# Patient Record
Sex: Female | Born: 1965 | Race: White | Hispanic: No | Marital: Single | Smoking: Never smoker
Health system: Southern US, Community
[De-identification: ages and names within clinical notes are randomized; demographics above are authoritative.]

## PROBLEM LIST (undated history)

## (undated) ENCOUNTER — Emergency Department (HOSPITAL_COMMUNITY): Disposition: A | Discharge status: Other Acute Inpatient Hospital | Payer: Commercial Managed Care - PPO

## (undated) ENCOUNTER — Emergency Department (HOSPITAL_COMMUNITY): Disposition: A | Discharge status: Home / Self Care | Payer: Self-pay

## (undated) DIAGNOSIS — D649 Anemia, unspecified: Secondary | ICD-10-CM

## (undated) DIAGNOSIS — R011 Cardiac murmur, unspecified: Secondary | ICD-10-CM

## (undated) DIAGNOSIS — K589 Irritable bowel syndrome without diarrhea: Secondary | ICD-10-CM

## (undated) DIAGNOSIS — T7840XA Allergy, unspecified, initial encounter: Secondary | ICD-10-CM

## (undated) HISTORY — DX: Allergy, unspecified, initial encounter: T78.40XA

## (undated) HISTORY — DX: Anemia, unspecified: D64.9

## (undated) HISTORY — DX: Irritable bowel syndrome, unspecified: K58.9

## (undated) HISTORY — DX: Cardiac murmur, unspecified: R01.1

---

## 2001-11-07 ENCOUNTER — Encounter (INDEPENDENT_AMBULATORY_CARE_PROVIDER_SITE_OTHER): Payer: Self-pay | Admitting: *Deleted

## 2001-11-07 LAB — CONVERTED CEMR LAB

## 2001-11-26 ENCOUNTER — Encounter: Admission: RE | Admit: 2001-11-26 | Discharge: 2001-11-26 | Payer: Self-pay | Admitting: Family Medicine

## 2001-12-28 ENCOUNTER — Encounter: Admission: RE | Admit: 2001-12-28 | Discharge: 2001-12-28 | Payer: Self-pay | Admitting: Sports Medicine

## 2002-01-05 ENCOUNTER — Ambulatory Visit (HOSPITAL_COMMUNITY): Admission: RE | Admit: 2002-01-05 | Discharge: 2002-01-05 | Payer: Self-pay

## 2002-01-18 ENCOUNTER — Encounter: Admission: RE | Admit: 2002-01-18 | Discharge: 2002-01-18 | Payer: Self-pay | Admitting: Family Medicine

## 2002-01-20 ENCOUNTER — Encounter: Admission: RE | Admit: 2002-01-20 | Discharge: 2002-01-20 | Payer: Self-pay | Admitting: Family Medicine

## 2002-01-27 ENCOUNTER — Encounter: Admission: RE | Admit: 2002-01-27 | Discharge: 2002-01-27 | Payer: Self-pay | Admitting: Family Medicine

## 2002-02-02 ENCOUNTER — Encounter: Admission: RE | Admit: 2002-02-02 | Discharge: 2002-03-18 | Payer: Self-pay | Admitting: Family Medicine

## 2002-02-05 ENCOUNTER — Encounter: Admission: RE | Admit: 2002-02-05 | Discharge: 2002-02-05 | Payer: Self-pay | Admitting: Family Medicine

## 2002-02-15 ENCOUNTER — Encounter: Admission: RE | Admit: 2002-02-15 | Discharge: 2002-02-15 | Payer: Self-pay | Admitting: Family Medicine

## 2002-03-02 ENCOUNTER — Encounter: Admission: RE | Admit: 2002-03-02 | Discharge: 2002-03-02 | Payer: Self-pay | Admitting: Sports Medicine

## 2002-03-11 ENCOUNTER — Encounter: Admission: RE | Admit: 2002-03-11 | Discharge: 2002-03-11 | Payer: Self-pay | Admitting: *Deleted

## 2002-03-14 ENCOUNTER — Encounter: Payer: Self-pay | Admitting: *Deleted

## 2002-03-15 ENCOUNTER — Inpatient Hospital Stay (HOSPITAL_COMMUNITY): Admission: AD | Admit: 2002-03-15 | Discharge: 2002-03-18 | Payer: Self-pay | Admitting: *Deleted

## 2002-03-22 ENCOUNTER — Encounter: Admission: RE | Admit: 2002-03-22 | Discharge: 2002-03-22 | Payer: Self-pay | Admitting: Family Medicine

## 2002-03-24 ENCOUNTER — Encounter (HOSPITAL_COMMUNITY): Admission: RE | Admit: 2002-03-24 | Discharge: 2002-04-19 | Payer: Self-pay | Admitting: *Deleted

## 2002-04-15 ENCOUNTER — Encounter: Payer: Self-pay | Admitting: *Deleted

## 2002-04-20 ENCOUNTER — Inpatient Hospital Stay (HOSPITAL_COMMUNITY): Admission: AD | Admit: 2002-04-20 | Discharge: 2002-04-23 | Payer: Self-pay | Admitting: *Deleted

## 2002-06-01 ENCOUNTER — Encounter: Admission: RE | Admit: 2002-06-01 | Discharge: 2002-06-01 | Payer: Self-pay | Admitting: Sports Medicine

## 2002-07-22 ENCOUNTER — Encounter: Admission: RE | Admit: 2002-07-22 | Discharge: 2002-07-22 | Payer: Self-pay | Admitting: Family Medicine

## 2002-07-28 ENCOUNTER — Encounter: Payer: Self-pay | Admitting: Sports Medicine

## 2002-07-28 ENCOUNTER — Encounter: Admission: RE | Admit: 2002-07-28 | Discharge: 2002-07-28 | Payer: Self-pay | Admitting: Sports Medicine

## 2002-08-12 ENCOUNTER — Encounter: Admission: RE | Admit: 2002-08-12 | Discharge: 2002-08-12 | Payer: Self-pay | Admitting: Family Medicine

## 2002-11-04 ENCOUNTER — Encounter: Admission: RE | Admit: 2002-11-04 | Discharge: 2002-11-04 | Payer: Self-pay | Admitting: Family Medicine

## 2002-11-17 ENCOUNTER — Encounter: Admission: RE | Admit: 2002-11-17 | Discharge: 2002-11-17 | Payer: Self-pay | Admitting: Family Medicine

## 2002-12-06 ENCOUNTER — Encounter: Admission: RE | Admit: 2002-12-06 | Discharge: 2002-12-06 | Payer: Self-pay | Admitting: Family Medicine

## 2005-09-21 ENCOUNTER — Inpatient Hospital Stay (HOSPITAL_COMMUNITY): Admission: AD | Admit: 2005-09-21 | Discharge: 2005-09-21 | Payer: Self-pay | Admitting: *Deleted

## 2005-09-24 ENCOUNTER — Other Ambulatory Visit: Admission: RE | Admit: 2005-09-24 | Discharge: 2005-09-24 | Payer: Self-pay | Admitting: Obstetrics & Gynecology

## 2005-09-24 ENCOUNTER — Ambulatory Visit: Payer: Self-pay | Admitting: Obstetrics & Gynecology

## 2005-12-18 ENCOUNTER — Ambulatory Visit: Payer: Self-pay | Admitting: Obstetrics and Gynecology

## 2006-07-21 ENCOUNTER — Inpatient Hospital Stay (HOSPITAL_COMMUNITY): Admission: AD | Admit: 2006-07-21 | Discharge: 2006-07-21 | Payer: Self-pay | Admitting: Obstetrics & Gynecology

## 2006-12-05 ENCOUNTER — Encounter (INDEPENDENT_AMBULATORY_CARE_PROVIDER_SITE_OTHER): Payer: Self-pay | Admitting: *Deleted

## 2006-12-10 ENCOUNTER — Inpatient Hospital Stay (HOSPITAL_COMMUNITY): Admission: AD | Admit: 2006-12-10 | Discharge: 2006-12-10 | Payer: Self-pay | Admitting: Obstetrics and Gynecology

## 2006-12-26 ENCOUNTER — Inpatient Hospital Stay (HOSPITAL_COMMUNITY): Admission: RE | Admit: 2006-12-26 | Discharge: 2006-12-31 | Payer: Self-pay | Admitting: Obstetrics and Gynecology

## 2007-04-23 IMAGING — US US PELVIS COMPLETE MODIFY
1 series · 14 of 25 positions shown · non-contrast
Comparison: none

CLINICAL DATA: Vaginal bleeding and cramping for three months.  Negative pregnancy test.
 TRANSABDOMINAL AND TRANSVAGINAL PELVIC ULTRASOUND ? 09/21/05:
TECHNIQUE: Both transabdominal and transvaginal ultrasound examinations of the pelvis were performed including evaluation of the uterus, ovaries, adnexal regions, and pelvic cul-de-sac.

[Series 1: us pelvis complete modify · 0.29mm/px · 14 of 85 slices shown]
[im 1/85]
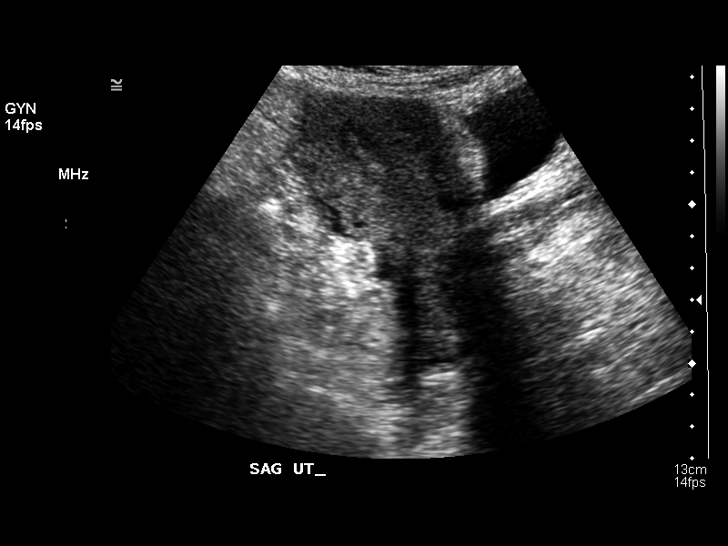
[im 8/85]
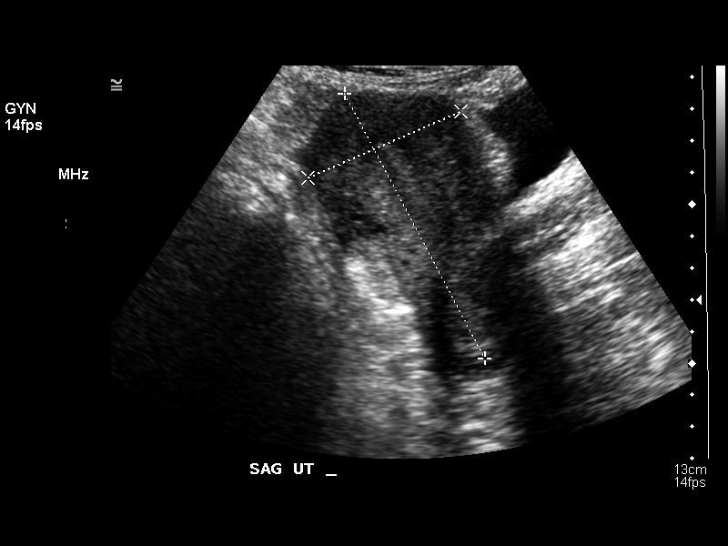
[im 15/85]
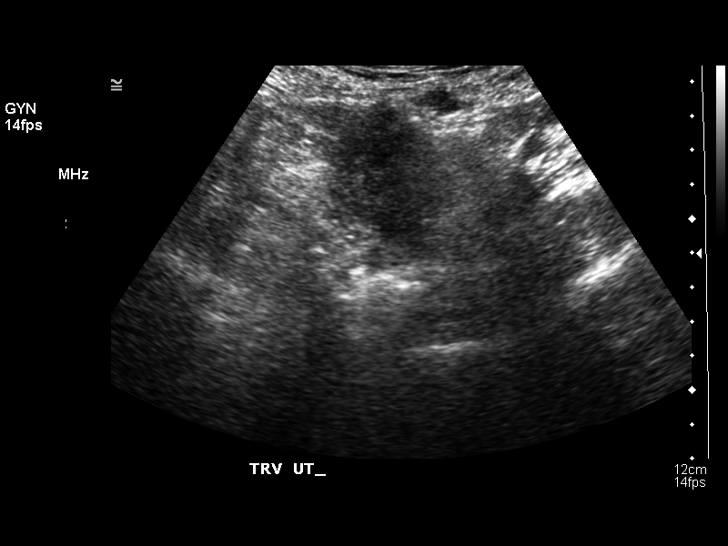
[im 22/85]
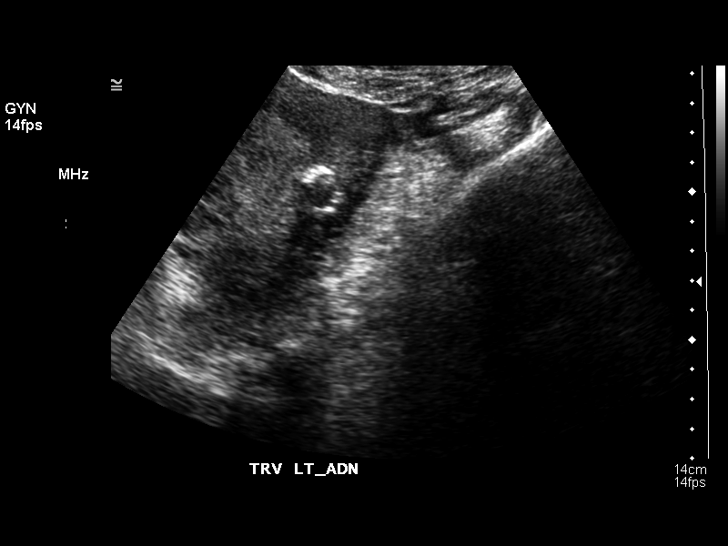
[im 29/85]
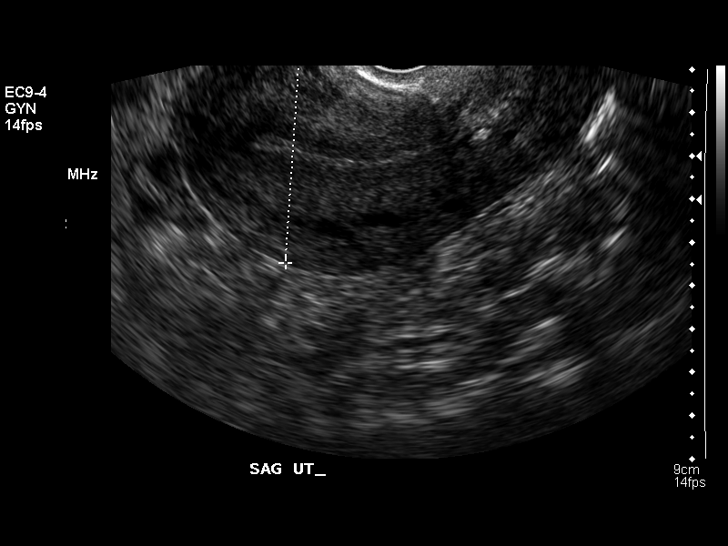
[im 32/85]
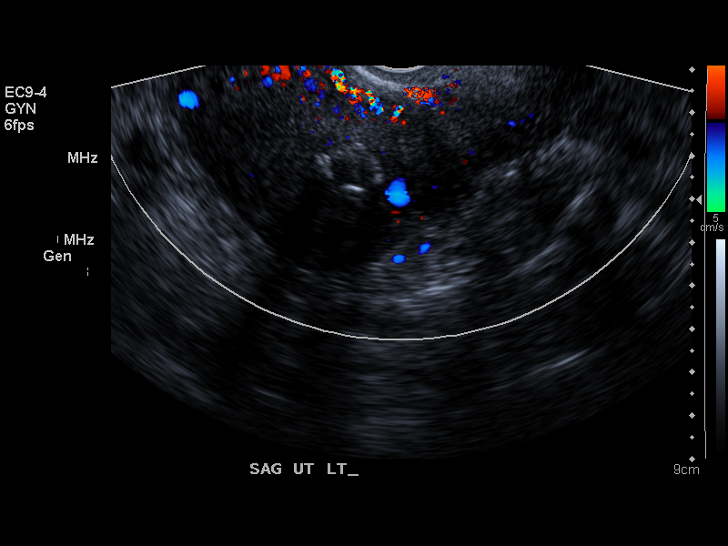
[im 39/85]
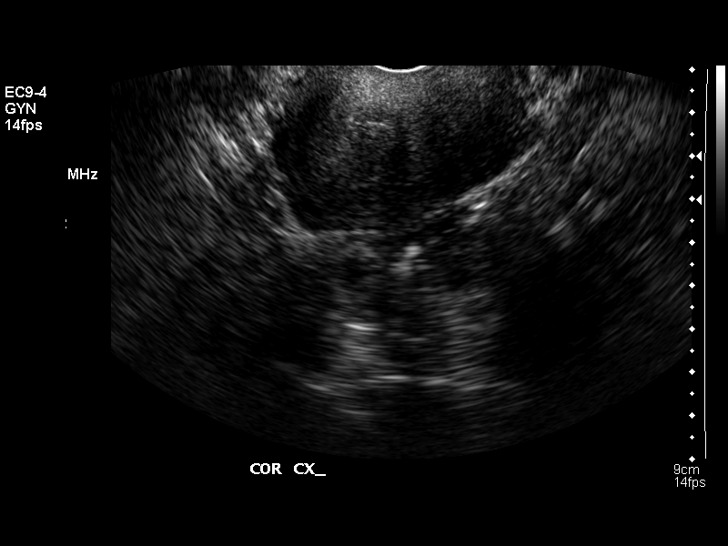
[im 46/85]
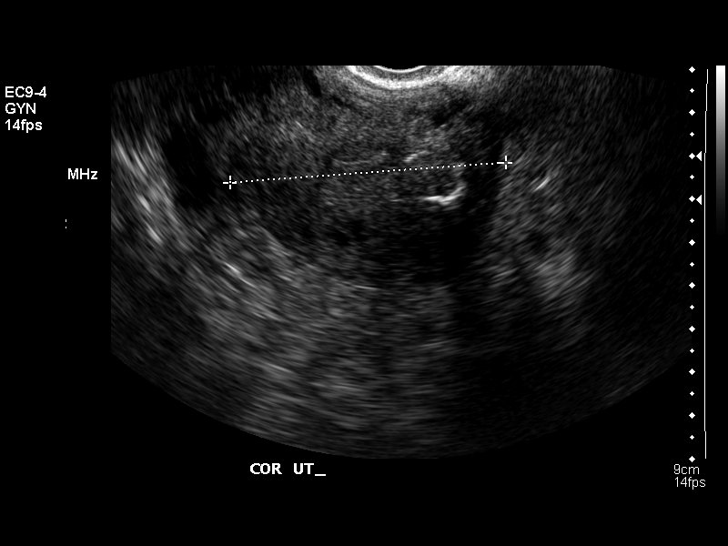
[im 53/85]
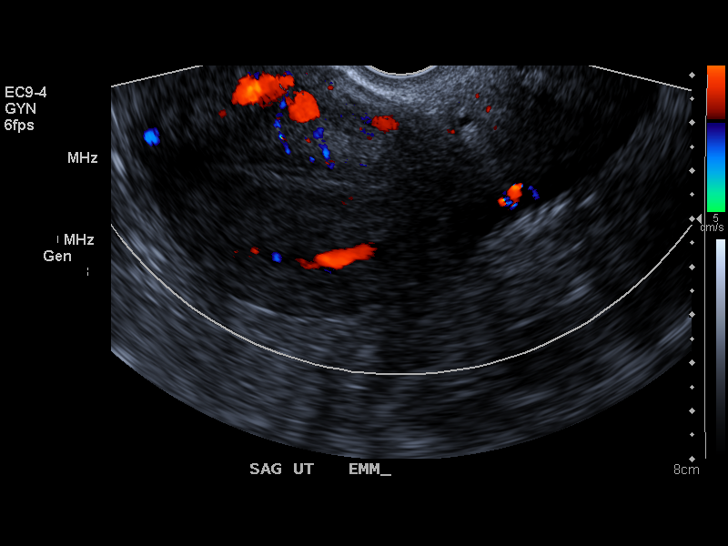
[im 57/85]
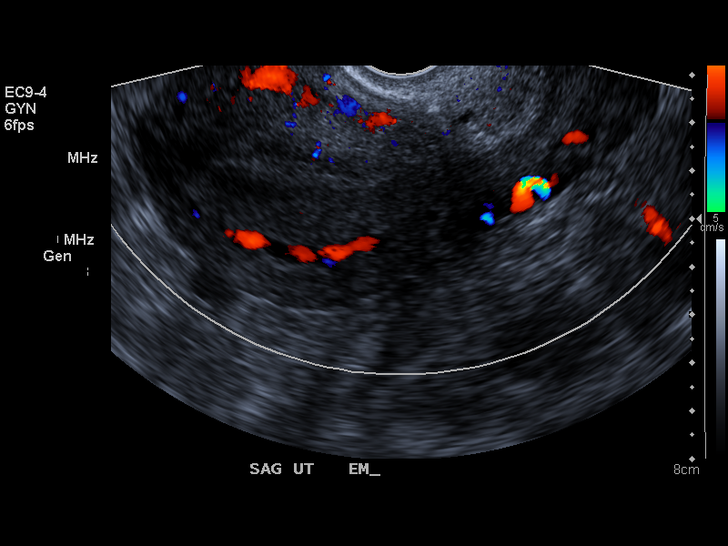
[im 64/85]
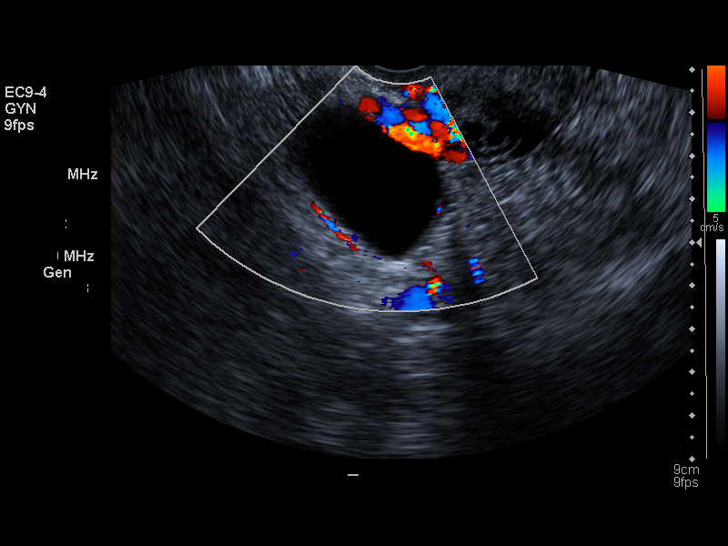
[im 71/85]
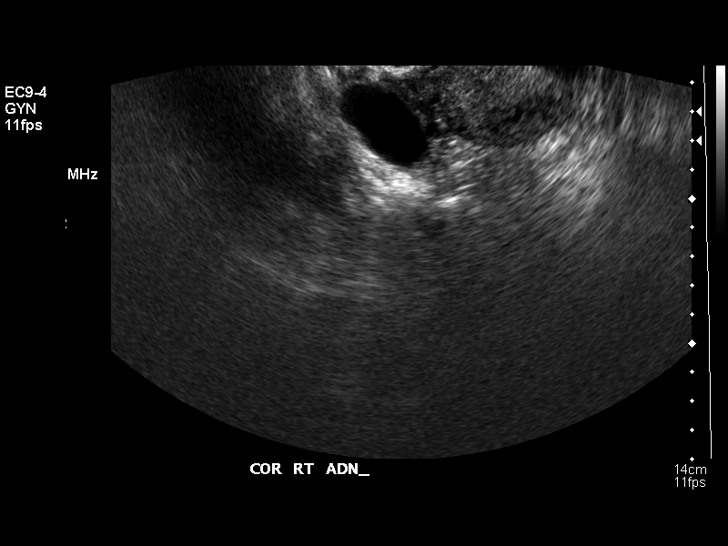
[im 78/85]
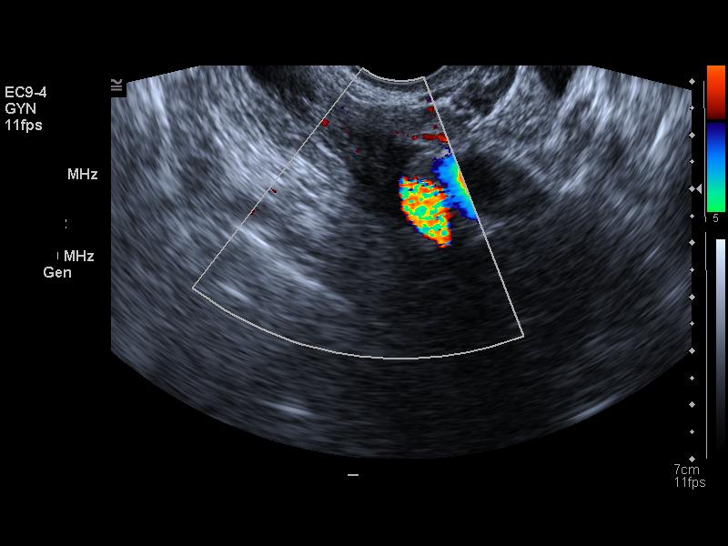
[im 85/85]
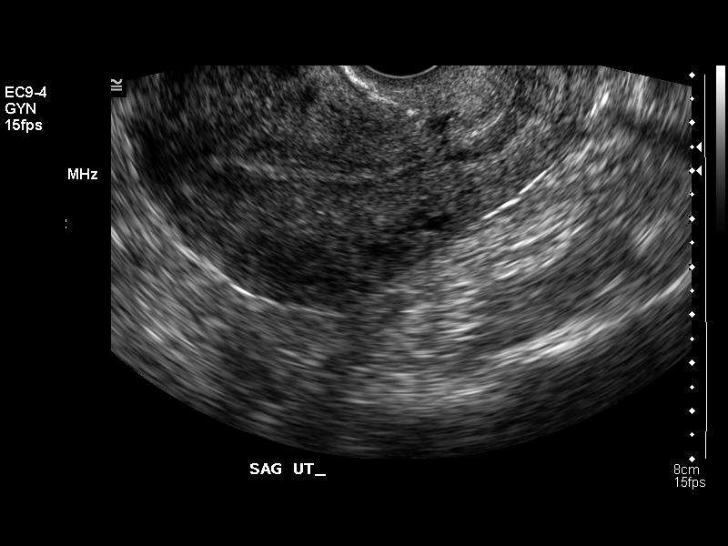

[14 of 25 positions shown; findings below may reference images not displayed]

FINDINGS: The uterus is anteverted and anteflexed.  The endometrial stripe measures 5 mm and has a predominantly trilaminar appearance.  There is a 3.2 x 2.7 x 2.4 cm simple appearing right ovarian cyst.  The left ovary is normal.  No free fluid is seen.  There is a calcified intramural left-sided uterine fibroid measuring 1.6 x 1.5 x 1.2 cm.  It demonstrates no appreciable mass effect upon the endometrium.
IMPRESSION: No sonographic abnormality to explain the history of bleeding.
 Simple appearing right ovarian cyst.
 Calcified left-sided intramural fibroid.

## 2008-02-20 IMAGING — US US OB COMP LESS 14 WK
1 series · 14 of 22 positions shown · non-contrast
Comparison: none

CLINICAL DATA: Positive home pregnancy test with cramping and bleeding.  
 OBSTETRICAL ULTRASOUND <14 WKS:
TECHNIQUE: Transabdominal ultrasound was performed for evaluation of the gestation as well as the maternal uterus and adnexal regions.

[Series 1: us ob comp less 14 wk · 0.33mm/px · 14 of 22 slices shown]
[im 1/22]
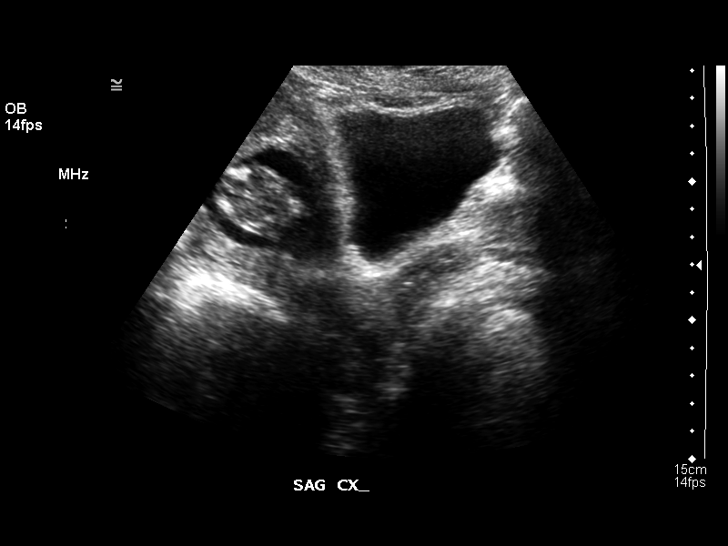
[im 3/22]
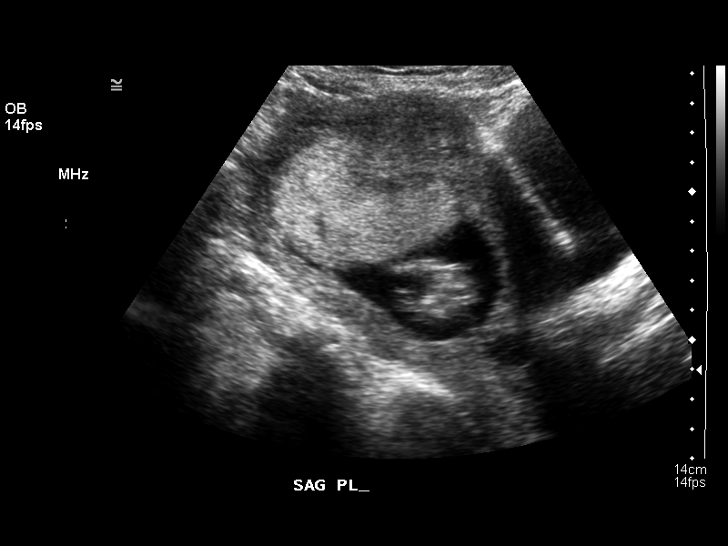
[im 4/22]
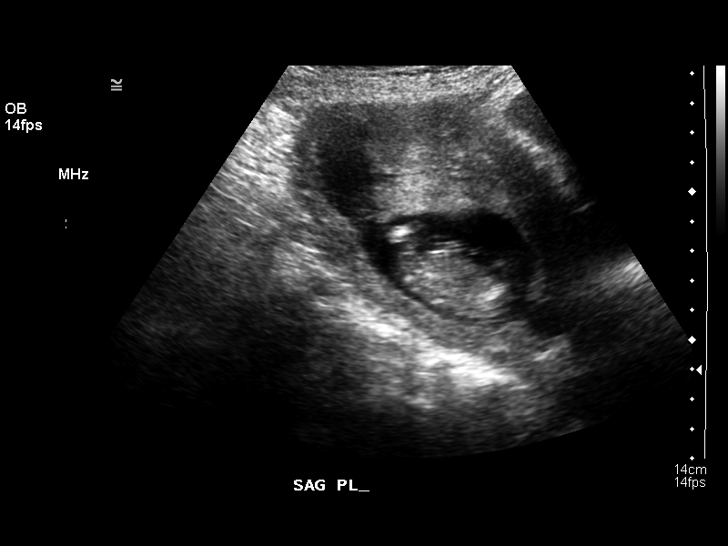
[im 6/22]
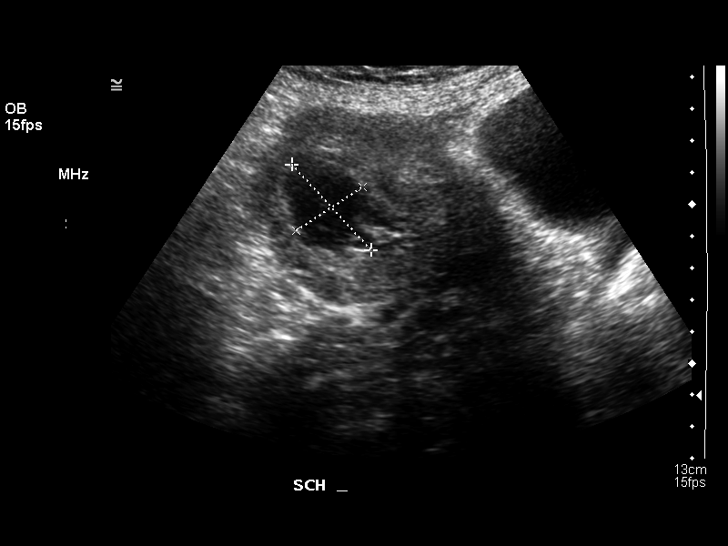
[im 8/22]
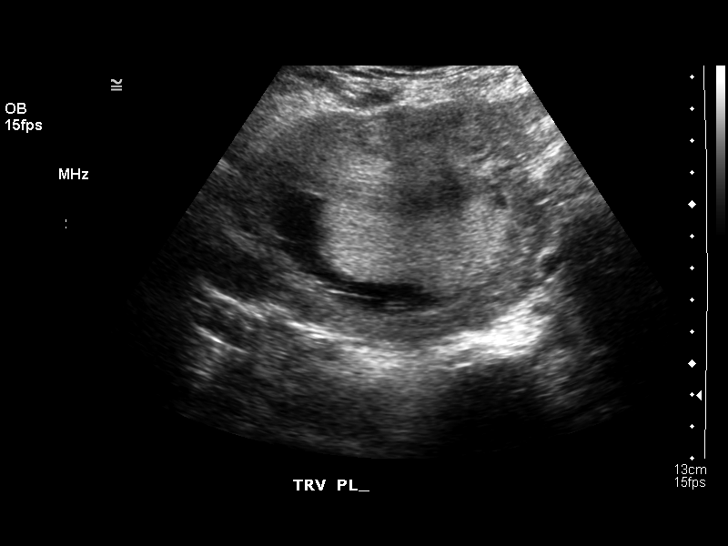
[im 9/22]
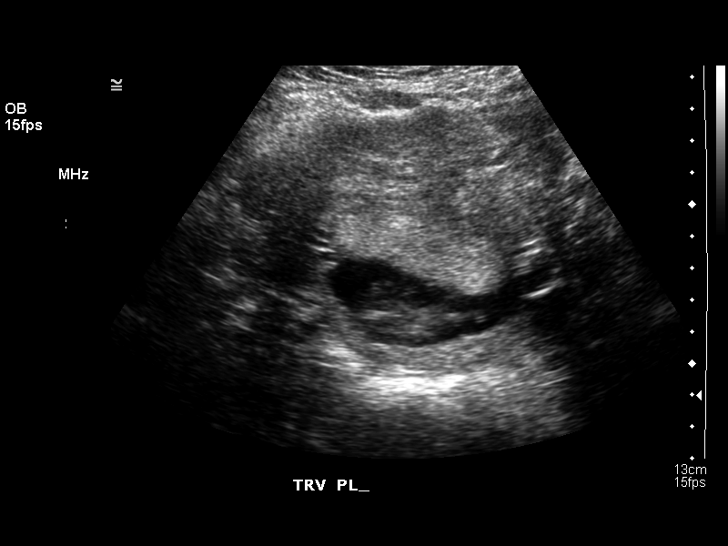
[im 11/22]
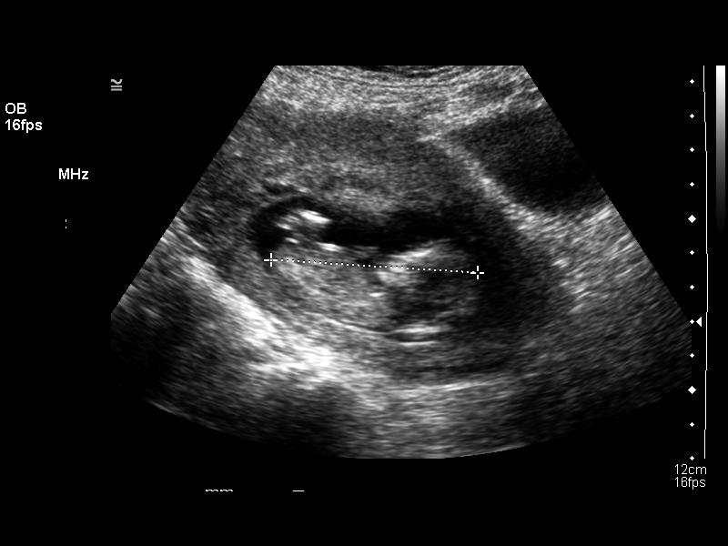
[im 12/22]
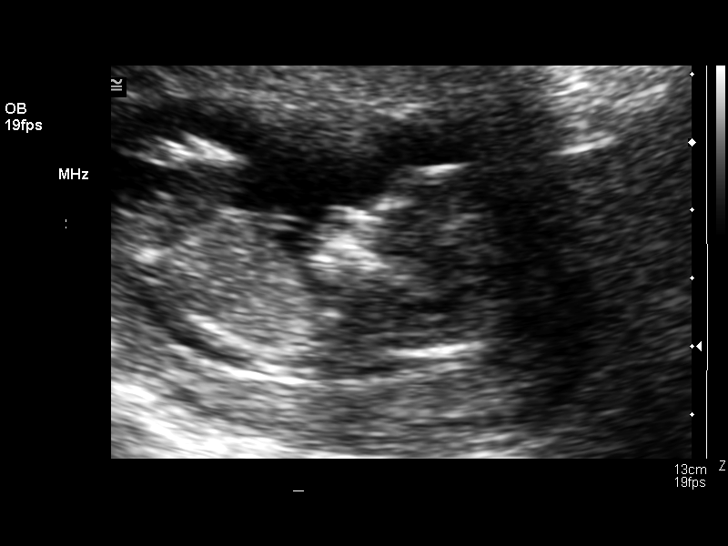
[im 14/22]
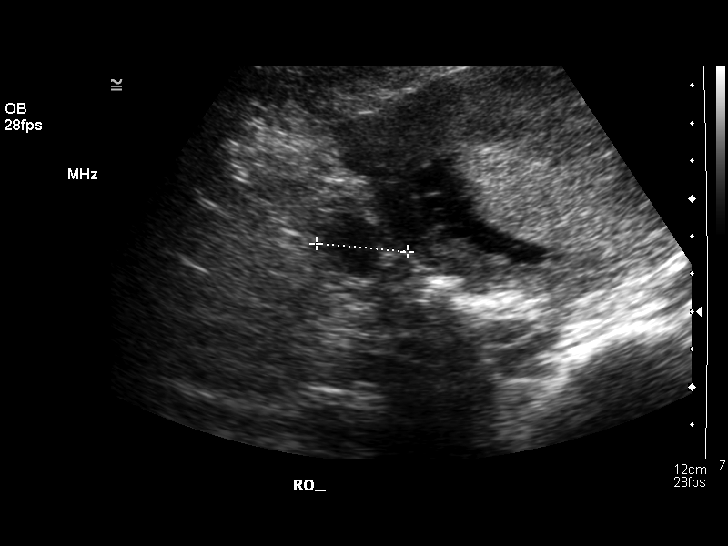
[im 15/22]
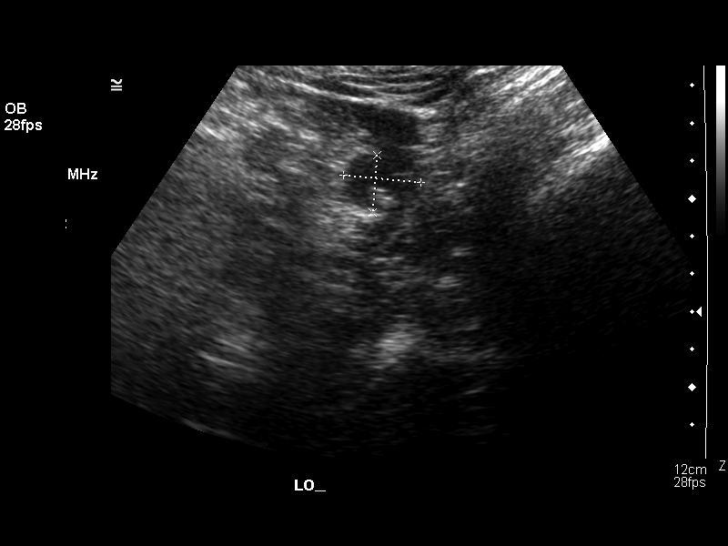
[im 17/22]
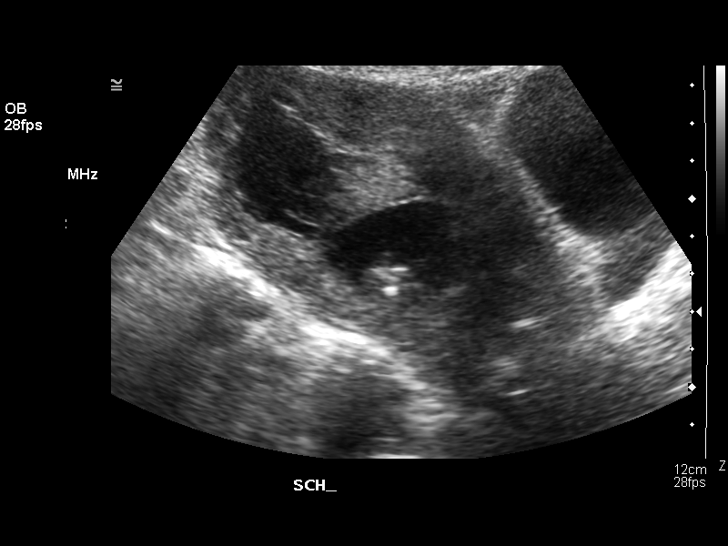
[im 19/22]
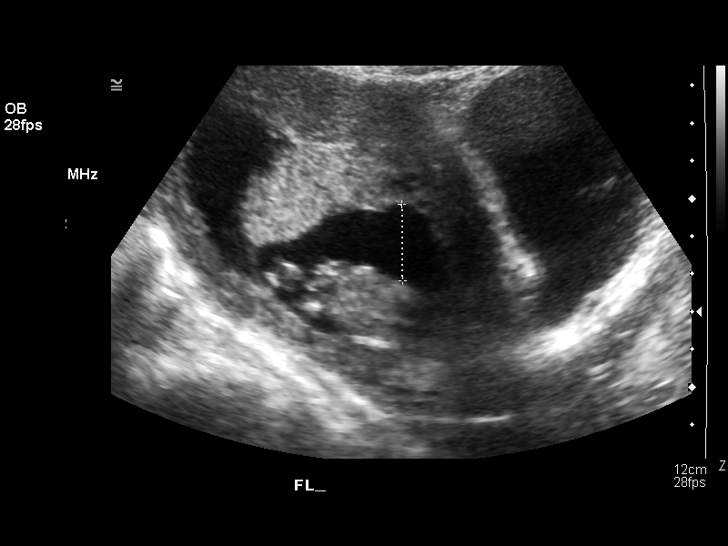
[im 20/22]
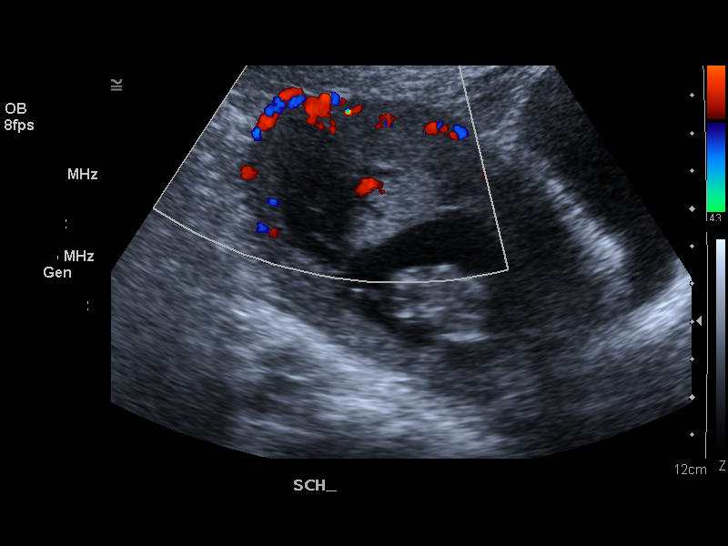
[im 22/22]
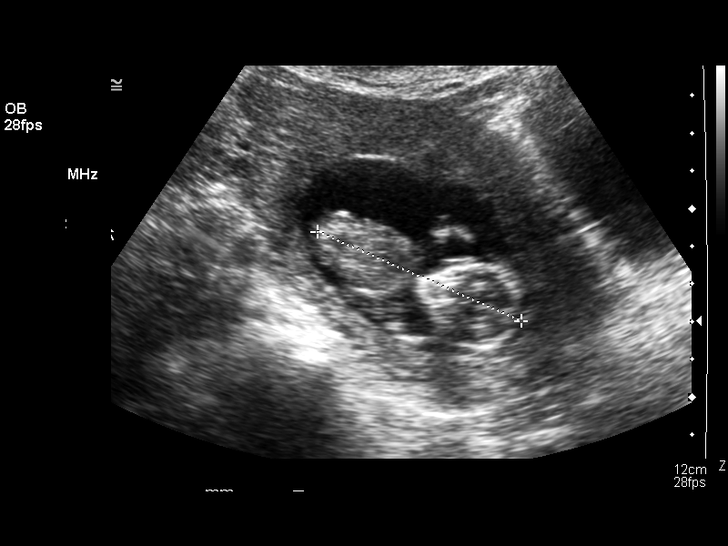

[14 of 22 positions shown; findings below may reference images not displayed]

FINDINGS: There is a single intrauterine pregnancy identified that demonstrates an estimated gestational age by crown-rump length of 12 weeks 3 days.  Positive regular fetal cardiac activity with the rate of 160 bpm was noted.  There is a moderate sized subchorionic hemorrhage identified which appears old in nature measuring 2.8 x 1.8 x 3.9 cm.  
 Both ovaries are seen with the left ovary measuring 2.1 x 1.5 x 1.6 cm and the right ovary measuring 2.4 x 2.6 x 1.7 cm.  No cul-de-sac or periovarian fluid is seen and no separate adnexal masses are noted.
IMPRESSION: 12 week 3 day living intrauterine pregnancy.  Moderate sized old subchorionic hemorrhage.

## 2010-10-27 ENCOUNTER — Other Ambulatory Visit: Payer: Self-pay | Admitting: *Deleted

## 2010-10-27 DIAGNOSIS — Z1239 Encounter for other screening for malignant neoplasm of breast: Secondary | ICD-10-CM

## 2010-11-26 ENCOUNTER — Other Ambulatory Visit: Payer: Self-pay | Admitting: Obstetrics and Gynecology

## 2010-11-26 ENCOUNTER — Other Ambulatory Visit (HOSPITAL_COMMUNITY)
Admission: RE | Admit: 2010-11-26 | Discharge: 2010-11-26 | Disposition: A | Discharge status: Home / Self Care | Payer: Commercial Managed Care - PPO | Source: Ambulatory Visit | Attending: Obstetrics and Gynecology | Admitting: Obstetrics and Gynecology

## 2010-11-26 ENCOUNTER — Ambulatory Visit (INDEPENDENT_AMBULATORY_CARE_PROVIDER_SITE_OTHER): Payer: Commercial Managed Care - PPO | Admitting: Obstetrics and Gynecology

## 2010-11-26 DIAGNOSIS — Z01419 Encounter for gynecological examination (general) (routine) without abnormal findings: Secondary | ICD-10-CM

## 2010-11-26 DIAGNOSIS — Z124 Encounter for screening for malignant neoplasm of cervix: Secondary | ICD-10-CM | POA: Insufficient documentation

## 2010-11-27 ENCOUNTER — Ambulatory Visit: Payer: Self-pay

## 2010-11-27 ENCOUNTER — Other Ambulatory Visit: Payer: Self-pay | Admitting: Obstetrics and Gynecology

## 2010-12-04 ENCOUNTER — Other Ambulatory Visit: Payer: Self-pay | Admitting: Obstetrics and Gynecology

## 2010-12-04 ENCOUNTER — Ambulatory Visit
Admission: RE | Admit: 2010-12-04 | Discharge: 2010-12-04 | Disposition: A | Discharge status: Home / Self Care | Payer: Commercial Managed Care - PPO | Source: Ambulatory Visit | Attending: Obstetrics and Gynecology | Admitting: Obstetrics and Gynecology

## 2011-09-20 ENCOUNTER — Encounter: Payer: Self-pay | Admitting: Gastroenterology

## 2011-10-16 ENCOUNTER — Ambulatory Visit: Payer: Commercial Managed Care - PPO | Admitting: Gastroenterology

## 2011-12-15 ENCOUNTER — Encounter (HOSPITAL_COMMUNITY): Payer: Self-pay

## 2011-12-15 ENCOUNTER — Emergency Department (HOSPITAL_COMMUNITY)
Admission: EM | Admit: 2011-12-15 | Discharge: 2011-12-15 | Disposition: A | Discharge status: Home / Self Care | Payer: Commercial Managed Care - PPO | Source: Home / Self Care | Attending: Emergency Medicine | Admitting: Emergency Medicine

## 2011-12-15 DIAGNOSIS — H6691 Otitis media, unspecified, right ear: Secondary | ICD-10-CM

## 2011-12-15 DIAGNOSIS — H6690 Otitis media, unspecified, unspecified ear: Secondary | ICD-10-CM

## 2011-12-15 MED ORDER — AMOXICILLIN 500 MG PO CAPS: 1000.0000 mg | ORAL_CAPSULE | Freq: Three times a day (TID) | ORAL | Status: AC

## 2011-12-15 MED ORDER — BENZONATATE 200 MG PO CAPS: 200.0000 mg | ORAL_CAPSULE | Freq: Three times a day (TID) | ORAL | Status: AC | PRN

## 2011-12-15 NOTE — ED Provider Notes (Signed)
Chief Complaint  Patient presents with  . URI    History of Present Illness:   The patient is a 46 year old female who comes in today with her 2 sons and states they all have the same thing. She has had a two-week history of sore throat, productive cough, anorexia, intermittent fevers, ear pain, sinus pressure, nausea, and vomiting.  Review of Systems:  Other than noted above, the patient denies any of the following symptoms. Systemic:  No fever, chills, sweats, fatigue, myalgias, headache, or anorexia. Eye:  No redness, pain or drainage. ENT:  No earache, nasal congestion, rhinorrhea, sinus pressure, or sore throat. Lungs:  No cough, sputum production, wheezing, shortness of breath. Or chest pain. GI:  No nausea, vomiting, abdominal pain or diarrhea. Skin:  No rash or itching.  PMFSH:  Past medical history, family history, social history, meds, and allergies were reviewed.  Physical Exam:   Vital signs:  BP 97/70  Pulse 99  Temp(Src) 99.2 F (37.3 C) (Oral)  Resp 18  SpO2 96%  LMP 12/12/2011 General:  Alert, in no distress. Eye:  No conjunctival injection or drainage. ENT:  The right TM was erythematous and dull without an air-fluid level, the left TM and canal are normal.  Nasal mucosa was clear and uncongested, without drainage.  Mucous membranes were moist.  Pharynx was erythematous, without exudate or drainage.  There were no oral ulcerations or lesions. Neck:  Supple, no adenopathy, tenderness or mass. Lungs:  No respiratory distress.  Lungs were clear to auscultation, without wheezes, rales or rhonchi.  Breath sounds were clear and equal bilaterally. Heart:  Regular rhythm, without gallops, murmers or rubs. Skin:  Clear, warm, and dry, without rash or lesions.  Assessment:   Diagnoses that have been ruled out:  None  Diagnoses that are still under consideration:  None  Final diagnoses:  Right otitis media      Plan:   1.  The following meds were prescribed:   New  Prescriptions   AMOXICILLIN (AMOXIL) 500 MG CAPSULE    Take 2 capsules (1,000 mg total) by mouth 3 (three) times daily.   BENZONATATE (TESSALON) 200 MG CAPSULE    Take 1 capsule (200 mg total) by mouth 3 (three) times daily as needed for cough.   2.  The patient was instructed in symptomatic care and handouts were given. 3.  The patient was told to return if becoming worse in any way, if no better in 3 or 4 days, and given some red flag symptoms that would indicate earlier return.   Reuben Likes, MD 12/15/11 1357

## 2011-12-15 NOTE — Discharge Instructions (Signed)
Otitis Media, Adult  A middle ear infection is an infection in the space behind the eardrum. The medical name for this is "otitis media." It may happen after a common cold. It is caused by a germ that starts growing in that space. You may feel swollen glands in your neck on the side of the ear infection.  HOME CARE INSTRUCTIONS   · Take your medicine as directed until it is gone, even if you feel better after the first few days.  · Only take over-the-counter or prescription medicines for pain, discomfort, or fever as directed by your caregiver.  · Occasional use of a nasal decongestant a couple times per day may help with discomfort and help the eustachian tube to drain better.  Follow up with your caregiver in 10 to 14 days or as directed, to be certain that the infection has cleared. Not keeping the appointment could result in a chronic or permanent injury, pain, hearing loss and disability. If there is any problem keeping the appointment, you must call back to this facility for assistance.  SEEK IMMEDIATE MEDICAL CARE IF:   · You are not getting better in 2 to 3 days.  · You have pain that is not controlled with medication.  · You feel worse instead of better.  · You cannot use the medication as directed.  · You develop swelling, redness or pain around the ear or stiffness in your neck.  MAKE SURE YOU:   · Understand these instructions.  · Will watch your condition.  · Will get help right away if you are not doing well or get worse.  Document Released: 06/28/2004 Document Revised: 09/12/2011 Document Reviewed: 04/29/2008  [employer]® Patient Information ©2012 [employer].

## 2011-12-15 NOTE — ED Notes (Signed)
Pt has sorethroat, cough, bodyaches and sneezing for two weeks.

## 2011-12-17 ENCOUNTER — Telehealth (HOSPITAL_COMMUNITY): Payer: Self-pay | Admitting: *Deleted

## 2011-12-18 ENCOUNTER — Telehealth (HOSPITAL_COMMUNITY): Payer: Self-pay | Admitting: *Deleted

## 2011-12-18 NOTE — ED Notes (Signed)
3/11 Pt. called on VM to call. 3/12 I called pt. back left a message. Pt. called me back and said she got a work note to return to work on 3/12.  She went to work and they sent her home. I told her if they require her note to be changed to call back, but if they sent her home so they may not require it. Vassie Moselle  12/18/2011

## 2012-02-03 ENCOUNTER — Ambulatory Visit (INDEPENDENT_AMBULATORY_CARE_PROVIDER_SITE_OTHER): Payer: Commercial Managed Care - PPO | Admitting: Internal Medicine

## 2012-02-03 VITALS — BP 97/67 | HR 71 | Temp 98.2°F | Resp 16 | Ht 61.0 in | Wt 135.0 lb

## 2012-02-03 DIAGNOSIS — D649 Anemia, unspecified: Secondary | ICD-10-CM

## 2012-02-03 DIAGNOSIS — R5383 Other fatigue: Secondary | ICD-10-CM

## 2012-02-03 DIAGNOSIS — D539 Nutritional anemia, unspecified: Secondary | ICD-10-CM

## 2012-02-03 DIAGNOSIS — G4700 Insomnia, unspecified: Secondary | ICD-10-CM

## 2012-02-03 LAB — POCT CBC
Granulocyte percent: 54.2 %G (ref 37–80)
HCT, POC: 34.7 % — AB (ref 37.7–47.9)
Hemoglobin: 10.8 g/dL — AB (ref 12.2–16.2)
MCV: 83.2 fL (ref 80–97)
POC Granulocyte: 4 (ref 2–6.9)
RBC: 4.17 M/uL (ref 4.04–5.48)

## 2012-02-03 NOTE — Progress Notes (Signed)
  Subjective:    Patient ID: Grace Lawrence, female    DOB: April 11, 1966, 46 y.o.   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  Eyes: Negative for visual disturbance.  Respiratory: Negative for shortness of breath.   Cardiovascular: Negative for chest pain and palpitations.  Gastrointestinal: Negative for abdominal pain, diarrhea, constipation and blood in stool.  Genitourinary: Negative for frequency and difficulty urinating.       Has a long history of heavy menses that are frequent Pap smear last year within normal limits No history of fibroids Status post BTL No pelvic pain or dyspareunia  Musculoskeletal: Negative for back pain.  Skin: Negative for rash.  Neurological: Negative for dizziness and light-headedness.  Hematological: Negative for adenopathy.       Objective:   Physical Exam Vital signs stable HEENT clear including no thyromegaly nodules or lymphadenopathy Chest clear Heart regular without murmurs rubs or gallops No peripheral edema No petechiae or ecchymoses       Results for orders placed in visit on 02/03/12  POCT CBC      Component Value Range   WBC 7.4  4.6 - 10.2 (K/uL)   Lymph, poc 2.9  0.6 - 3.4    POC LYMPH PERCENT 39.4  10 - 50 (%L)   MID (cbc) 0.5  0 - 0.9    POC MID % 6.4  0 - 12 (%M)   POC Granulocyte 4.0  2 - 6.9    Granulocyte percent 54.2  37 - 80 (%G)    RBC 4.17  4.04 - 5.48 (M/uL)   Hemoglobin 10.8 (*) 12.2 - 16.2 (g/dL)   HCT, POC 11.9 (*) 14.7 - 47.9 (%)   MCV 83.2  80 - 97 (fL)   MCH, POC 25.9 (*) 27 - 31.2 (pg)   MCHC 31.3 (*) 31.8 - 35.4 (g/dL)   RDW, POC 82.9     Platelet Count, POC 340  142 - 424 (K/uL)   MPV 9.0  0 - 99.8 (fL)    Assessment & Plan:  Problem #1 fatigue Problem #2 anemia Check FE and TIBC, metabolic profile, TSH Will contact with results and plan

## 2012-02-04 LAB — COMPREHENSIVE METABOLIC PANEL
Albumin: 4.6 g/dL (ref 3.5–5.2)
BUN: 12 mg/dL (ref 6–23)
Calcium: 9.5 mg/dL (ref 8.4–10.5)
Chloride: 106 mEq/L (ref 96–112)
Glucose, Bld: 105 mg/dL — ABNORMAL HIGH (ref 70–99)
Potassium: 4.3 mEq/L (ref 3.5–5.3)
Sodium: 136 mEq/L (ref 135–145)
Total Protein: 7.8 g/dL (ref 6.0–8.3)

## 2012-02-04 LAB — IRON AND TIBC
%SAT: 5 % — ABNORMAL LOW (ref 20–55)
TIBC: 439 ug/dL (ref 250–470)
UIBC: 417 ug/dL — ABNORMAL HIGH (ref 125–400)

## 2012-02-04 LAB — LIPID PANEL
Cholesterol: 166 mg/dL (ref 0–200)
LDL Cholesterol: 97 mg/dL (ref 0–99)
Triglycerides: 75 mg/dL (ref <150)

## 2012-02-05 ENCOUNTER — Encounter: Payer: Self-pay | Admitting: Internal Medicine

## 2012-02-18 ENCOUNTER — Ambulatory Visit (INDEPENDENT_AMBULATORY_CARE_PROVIDER_SITE_OTHER): Payer: Commercial Managed Care - PPO | Admitting: Family Medicine

## 2012-02-18 VITALS — BP 109/69 | HR 74 | Temp 98.1°F | Resp 16 | Ht 60.5 in | Wt 137.0 lb

## 2012-02-18 DIAGNOSIS — S335XXA Sprain of ligaments of lumbar spine, initial encounter: Secondary | ICD-10-CM

## 2012-02-18 DIAGNOSIS — M549 Dorsalgia, unspecified: Secondary | ICD-10-CM

## 2012-02-18 MED ORDER — CYCLOBENZAPRINE HCL 10 MG PO TABS: 10.0000 mg | ORAL_TABLET | Freq: Two times a day (BID) | ORAL | Status: AC | PRN

## 2012-02-18 NOTE — Progress Notes (Signed)
  Patient Name: Grace Lawrence Date of Birth: 11/08/1965 Medical Record Number: [id number] Gender: female Date of Encounter: 02/18/2012  History of Present Illness:  Grace Lawrence is a 46 y.o. very pleasant female patient who presents with the following:  History of LBP in the past- usually ok but periodically will have pain in her low back.  She has had problems with it for the past few years.  She had to lift her son 2 days ago because he was ill- she did not feel any "pull or snap" but she did note that her back pain came back as soon as she straightened up from lifting.  She feels an ache and tightness in her lower back and hip flexors.    No LE weakness or numbness, no incontinence.   LMP 01/27/12  There is no problem list on file for this patient.  No past medical history on file. No past surgical history on file. History  Substance Use Topics  . Smoking status: Never Smoker   . Smokeless tobacco: Not on file  . Alcohol Use: 0.0 oz/week     social   No family history on file. Allergies  Allergen Reactions  . Aspergillus Fumigatus   . Codeine Itching  . Erythromycin Nausea And Vomiting and Swelling  . Sulfa Antibiotics Other (See Comments)    child    Medication list has been reviewed and updated.  Review of Systems: As per HPI- otherwise negative.   Physical Examination: Filed Vitals:   02/18/12 0830  BP: 109/69  Pulse: 74  Temp: 98.1 F (36.7 C)  TempSrc: Oral  Resp: 16  Height: 5' 0.5" (1.537 m)  Weight: 137 lb (62.143 kg)  SpO2: 98%    Body mass index is 26.32 kg/(m^2).  GEN: WDWN, NAD, Non-toxic, A & O x 3 HEENT: Atraumatic, Normocephalic. Neck supple. No masses, No LAD.  Ears and Nose: No external deformity. CV: RRR, No M/G/R. No JVD. No thrill. No extra heart sounds. PULM: CTA B, no wheezes, crackles, rhonchi. No retractions. No resp. distress. No accessory muscle use. ABD: S, NT, ND, +BS. No rebound. No HSM. EXTR: No  c/c/e NEURO Normal gait.  PSYCH: Normally interactive. Conversant. Not depressed or anxious appearing.  Calm demeanor.  Back:  Some mild tednerness in the lumbar area.  LE with full strength and ROM, full sensation.  Normal patellar DTR, negative straight leg raise  Denies any numbness, weakness or incontinence stool or urine   Assessment and Plan: 1. Back pain  cyclobenzaprine (FLEXERIL) 10 MG tablet   Lumbar strain- may use flexeril as needed especially at night.  Gave back owners manual- exercises may help as well.  Ok to use ibuprofen as needed as well.  Let us know if not doing better in the next few days- Sooner if worse.

## 2012-03-09 ENCOUNTER — Ambulatory Visit (HOSPITAL_COMMUNITY): Payer: Commercial Managed Care - PPO | Admitting: Psychiatry

## 2012-05-08 ENCOUNTER — Ambulatory Visit (INDEPENDENT_AMBULATORY_CARE_PROVIDER_SITE_OTHER): Payer: Commercial Managed Care - PPO | Admitting: Emergency Medicine

## 2012-05-08 VITALS — BP 103/67 | HR 57 | Temp 98.3°F | Resp 14 | Ht 60.0 in | Wt 137.0 lb

## 2012-05-08 DIAGNOSIS — D500 Iron deficiency anemia secondary to blood loss (chronic): Secondary | ICD-10-CM

## 2012-05-08 DIAGNOSIS — R5383 Other fatigue: Secondary | ICD-10-CM

## 2012-05-08 DIAGNOSIS — R5381 Other malaise: Secondary | ICD-10-CM

## 2012-05-08 LAB — POCT CBC
Granulocyte percent: 55.7 %G (ref 37–80)
HCT, POC: 44.8 % (ref 37.7–47.9)
Hemoglobin: 14 g/dL (ref 12.2–16.2)
Lymph, poc: 2.8 (ref 0.6–3.4)
MCV: 92.5 fL (ref 80–97)
POC Granulocyte: 4.3 (ref 2–6.9)
POC LYMPH PERCENT: 37 %L (ref 10–50)
RDW, POC: 14.9 %

## 2012-05-08 NOTE — Progress Notes (Signed)
  Subjective:    Patient ID: Grace Lawrence, female    DOB: 26-Aug-1966, 46 y.o.   MRN: [id number]  Anemia There has been no abdominal pain, anorexia, bruising/bleeding easily, confusion, fever, leg swelling, light-headedness, malaise/fatigue, pallor, palpitations, paresthesias, pica or weight loss. Signs of blood loss that are present include menorrhagia and vaginal bleeding. Signs of blood loss that are not present include hematemesis, hematochezia and melena. Compliance with medications is 76-100%.      Review of Systems  Constitutional: Negative for fever, weight loss, malaise/fatigue, activity change, appetite change and fatigue.  HENT: Negative.   Eyes: Negative.   Respiratory: Negative.   Cardiovascular: Negative for palpitations.  Gastrointestinal: Negative.  Negative for abdominal pain, melena, hematochezia, anorexia and hematemesis.  Genitourinary: Positive for vaginal bleeding, menstrual problem and menorrhagia. Negative for urgency, decreased urine volume, vaginal discharge, vaginal pain and pelvic pain.  Musculoskeletal: Negative.   Skin: Negative for pallor.  Neurological: Negative for light-headedness and paresthesias.  Hematological: Does not bruise/bleed easily.  Psychiatric/Behavioral: Negative for confusion.       Objective:   Physical Exam  Constitutional: She is oriented to person, place, and time. She appears well-developed and well-nourished.  HENT:  Head: Normocephalic and atraumatic.  Eyes: Conjunctivae and EOM are normal. Pupils are equal, round, and reactive to light.  Neck: Normal range of motion.  Cardiovascular: Normal rate and regular rhythm.   Pulmonary/Chest: Effort normal.  Abdominal: Soft.  Musculoskeletal: Normal range of motion.  Neurological: She is alert and oriented to person, place, and time.  Skin: Skin is warm and dry.          Assessment & Plan:  History iron deficiency anemia 2 to menses Taking iron and multivitamin    Will check labs Follow up in 3 months  Results for orders placed in visit on 05/08/12  POCT CBC      Component Value Range   WBC 7.7  4.6 - 10.2 K/uL   Lymph, poc 2.8  0.6 - 3.4   POC LYMPH PERCENT 37.0  10 - 50 %L   MID (cbc) 0.6  0 - 0.9   POC MID % 7.3  0 - 12 %M   POC Granulocyte 4.3  2 - 6.9   Granulocyte percent 55.7  37 - 80 %G   RBC 4.84  4.04 - 5.48 M/uL   Hemoglobin 14.0  12.2 - 16.2 g/dL   HCT, POC 78.2  95.6 - 47.9 %   MCV 92.5  80 - 97 fL   MCH, POC 28.9  27 - 31.2 pg   MCHC 31.3 (*) 31.8 - 35.4 g/dL   RDW, POC 21.3     Platelet Count, POC 282  142 - 424 K/uL   MPV 9.5  0 - 99.8 fL

## 2012-05-09 LAB — TSH: TSH: 2.983 u[IU]/mL (ref 0.350–4.500)

## 2012-05-09 LAB — FERRITIN: Ferritin: 36 ng/mL (ref 10–291)

## 2012-05-10 LAB — IBC PANEL
TIBC: 349 ug/dL (ref 250–470)
UIBC: 287 ug/dL (ref 125–400)

## 2012-05-11 LAB — VITAMIN D 25 HYDROXY (VIT D DEFICIENCY, FRACTURES): Vit D, 25-Hydroxy: 38 ng/mL (ref 30–89)

## 2014-08-06 ENCOUNTER — Ambulatory Visit (INDEPENDENT_AMBULATORY_CARE_PROVIDER_SITE_OTHER): Payer: Self-pay | Admitting: Emergency Medicine

## 2014-08-06 VITALS — BP 116/68 | HR 71 | Temp 98.7°F | Resp 18 | Ht 60.0 in | Wt 145.0 lb

## 2014-08-06 DIAGNOSIS — Z021 Encounter for pre-employment examination: Secondary | ICD-10-CM

## 2014-08-06 DIAGNOSIS — Z111 Encounter for screening for respiratory tuberculosis: Secondary | ICD-10-CM

## 2014-08-06 NOTE — Addendum Note (Signed)
Addended by: Tawnya CrookSAMBATH, Adalea Handler on: 08/06/2014 03:13 PM   Modules accepted: Orders

## 2014-08-06 NOTE — Progress Notes (Signed)
[facility] [address], [place] [id number] [phone]  Date:  08/06/2014   Name:  Grace Lawrence   DOB:  Feb 20, 1966   MRN:  [id number]  PCP:  No primary provider on file.    Chief Complaint: Employment Physical and tb screening   History of Present Illness:  Grace Lawrence is a 48 y.o. very pleasant female patient who presents with the following:  Teacher PE  There are no active problems to display for this patient.   Past Medical History  Diagnosis Date  . Allergy   . Anemia   . Heart murmur   . IBS (irritable bowel syndrome)     Past Surgical History  Procedure Laterality Date  . Cesarean section      History  Substance Use Topics  . Smoking status: Never Smoker   . Smokeless tobacco: Not on file  . Alcohol Use: 0.0 oz/week     Comment: social    Family History  Problem Relation Age of Onset  . Diabetes Mother   . Cancer Father   . Cancer Sister     Allergies  Allergen Reactions  . Aspergillus Fumigatus   . Codeine Itching  . Erythromycin Nausea And Vomiting and Swelling  . Sulfa Antibiotics Other (See Comments)    child    Medication list has been reviewed and updated.    Review of Systems:  As per HPI, otherwise negative.    Physical Examination: Filed Vitals:   08/06/14 1431  BP: 116/68  Pulse: 71  Temp: 98.7 F (37.1 C)  Resp: 18   Filed Vitals:   08/06/14 1431  Height: 5' (1.524 m)  Weight: 145 lb (65.772 kg)   Body mass index is 28.32 kg/(m^2). Ideal Body Weight: Weight in (lb) to have BMI = 25: 127.7  GEN: WDWN, NAD, Non-toxic, A & O x 3 HEENT: Atraumatic, Normocephalic. Neck supple. No masses, No LAD. Ears and Nose: No external deformity. CV: RRR, No M/G/R. No JVD. No thrill. No extra heart sounds. PULM: CTA B, no wheezes, crackles, rhonchi. No retractions. No resp. distress. No accessory muscle use. ABD: S, NT, ND, +BS. No rebound. No HSM. EXTR: No c/c/e NEURO Normal gait.   PSYCH: Normally interactive. Conversant. Not depressed or anxious appearing.  Calm demeanor.    Assessment and Plan: Teacher exam   Signed,  Phillips OdorJeffery Larosa Rhines, MD

## 2014-08-06 NOTE — Progress Notes (Signed)

## 2014-08-08 ENCOUNTER — Ambulatory Visit (INDEPENDENT_AMBULATORY_CARE_PROVIDER_SITE_OTHER): Payer: Self-pay | Admitting: *Deleted

## 2014-08-08 DIAGNOSIS — Z111 Encounter for screening for respiratory tuberculosis: Secondary | ICD-10-CM

## 2014-08-08 DIAGNOSIS — Z7689 Persons encountering health services in other specified circumstances: Secondary | ICD-10-CM

## 2014-08-08 LAB — TB SKIN TEST
Induration: 0 mm
TB SKIN TEST: NEGATIVE

## 2015-03-12 ENCOUNTER — Ambulatory Visit (INDEPENDENT_AMBULATORY_CARE_PROVIDER_SITE_OTHER): Payer: Self-pay | Admitting: Emergency Medicine

## 2015-03-12 VITALS — BP 106/69 | HR 69 | Temp 98.5°F | Resp 16 | Ht 60.5 in | Wt 145.6 lb

## 2015-03-12 DIAGNOSIS — K1120 Sialoadenitis, unspecified: Secondary | ICD-10-CM

## 2015-03-12 MED ORDER — CEPHALEXIN 500 MG PO CAPS: 500.0000 mg | ORAL_CAPSULE | Freq: Four times a day (QID) | ORAL | Status: DC

## 2015-03-12 NOTE — Progress Notes (Signed)
Subjective:  Patient ID: Grace Lawrence, female    DOB: [birth year]  Age: 49 y.o. MRN: [id number]  CC: Jaw Pain   HPI Grace Lawrence presents  with a several day history of pain and swelling in the right cheek. She has no dental pain. No thermal sensitivity. She has no dental decay. He has no fever or chills. She has no history of illness or antecedent injury. She has no improvement with over-the-counter medication.  History Grace Lawrence has a past medical history of Allergy; Anemia; Heart murmur; and IBS (irritable bowel syndrome).   She has past surgical history that includes Cesarean section.   Her  family history includes Cancer in her father and sister; Diabetes in her mother.  She   reports that she has never smoked. She does not have any smokeless tobacco history on file. She reports that she drinks alcohol. She reports that she does not use illicit drugs.  Outpatient Prescriptions Prior to Visit  Medication Sig Dispense Refill  . cetirizine (ZYRTEC) 10 MG tablet Take 10 mg by mouth daily.    . fexofenadine (ALLEGRA) 180 MG tablet Take 180 mg by mouth daily.    . diphenhydrAMINE (BENADRYL) 25 MG tablet Take 25 mg by mouth every 6 (six) hours as needed.    . ferrous sulfate 325 (65 FE) MG tablet Take 325 mg by mouth 2 (two) times daily.    . Multiple Vitamin (MULTIVITAMIN) tablet Take 1 tablet by mouth daily.    . pseudoephedrine (SUDAFED) 60 MG tablet Take 60 mg by mouth every 4 (four) hours as needed.    . triamcinolone (NASACORT ALLERGY 24HR) 55 MCG/ACT AERO nasal inhaler Place 2 sprays into the nose daily.    Marland Kitchen. UNABLE TO FIND Med Name: Zatodor eye gtts     No facility-administered medications prior to visit.    History   Social History  . Marital Status: Single    Spouse Name: N/A  . Number of Children: N/A  . Years of Education: N/A   Social History Main Topics  . Smoking status: Never Smoker   . Smokeless tobacco: Not on file  . Alcohol Use: 0.0  oz/week     Comment: social  . Drug Use: No  . Sexual Activity: Not on file   Other Topics Concern  . None   Social History Narrative     Review of Systems  Constitutional: Negative for fever, chills and appetite change.  HENT: Positive for facial swelling. Negative for congestion, ear pain, postnasal drip, sinus pressure and sore throat.   Eyes: Negative for pain and redness.  Respiratory: Negative for cough, shortness of breath and wheezing.   Cardiovascular: Negative for leg swelling.  Gastrointestinal: Negative for nausea, vomiting, abdominal pain, diarrhea, constipation and blood in stool.  Endocrine: Negative for polyuria.  Genitourinary: Negative for dysuria, urgency, frequency and flank pain.  Musculoskeletal: Negative for gait problem.  Skin: Negative for rash.  Neurological: Negative for weakness and headaches.  Psychiatric/Behavioral: Negative for confusion and decreased concentration. The patient is not nervous/anxious.     Objective:  BP 106/69 mmHg  Pulse 69  Temp(Src) 98.5 F (36.9 C) (Oral)  Resp 16  Ht 5' 0.5" (1.537 m)  Wt 145 lb 9.6 oz (66.044 kg)  BMI 27.96 kg/m2  SpO2 98%  Physical Exam  Constitutional: She is oriented to person, place, and time. She appears well-developed and well-nourished. No distress.  HENT:  Head: Normocephalic and atraumatic.    Right Ear: External  ear normal.  Left Ear: External ear normal.  Nose: Nose normal.  Eyes: Conjunctivae and EOM are normal. Pupils are equal, round, and reactive to light. No scleral icterus.  Neck: Normal range of motion. Neck supple. No tracheal deviation present.  Cardiovascular: Normal rate, regular rhythm and normal heart sounds.   Pulmonary/Chest: Effort normal. No respiratory distress. She has no wheezes. She has no rales.  Abdominal: She exhibits no mass. There is no tenderness. There is no rebound and no guarding.  Musculoskeletal: She exhibits no edema.  Lymphadenopathy:    She has no  cervical adenopathy.  Neurological: She is alert and oriented to person, place, and time. Coordination normal.  Skin: Skin is warm and dry. No rash noted.  Psychiatric: She has a normal mood and affect. Her behavior is normal.      Assessment & Plan:   [place]ia was seen today for jaw pain.  Diagnoses and all orders for this visit:  Sialadenitis  Other orders -     cephALEXin (KEFLEX) 500 MG capsule; Take 1 capsule (500 mg total) by mouth 4 (four) times daily.   I am having Ms. Stitely start on cephALEXin. I am also having her maintain her fexofenadine, pseudoephedrine, cetirizine, UNABLE TO FIND, multivitamin, ferrous sulfate, triamcinolone, and diphenhydrAMINE.  Meds ordered this encounter  Medications  . cephALEXin (KEFLEX) 500 MG capsule    Sig: Take 1 capsule (500 mg total) by mouth 4 (four) times daily.    Dispense:  40 capsule    Refill:  0     was suggested to her that she try keeping some hard candy in her mouth to stimulate saliva flow this doesn't resolve her problems in the next couple days I told her we have to refer her to ear nose throat specialist but does not completely resolve she is to follow-up  Appropriate red flag conditions were discussed with the patient as well as actions that should be taken.  Patient expressed his understanding.  Follow-up: Return if symptoms worsen or fail to improve.  Carmelina Dane, MD

## 2015-03-12 NOTE — Patient Instructions (Signed)
Salivary Gland Infection °A salivary gland infection can be caused by a virus, bacteria from the mouth, or a stone. Mumps and other viruses may settle in one or more of the saliva glands. This will result in swelling, pain, and difficulty eating. Bacteria may cause a more severe infection in a salivary gland. A salivary stone blocking the flow of saliva can make this worse. These infections may be related to other medical problems. Some of these are dehydration, recent surgery, poor nutrition, and some medications. °TREATMENT  °Treatment of a salivary gland infection depends on the cause. Mumps and other virus infections do not require antibiotics. If bacteria cause the infection, then antibiotics are needed to get rid of the infection. If there is a salivary stone blocking the duct, minor surgery to remove the stone may be needed.  °HOME CARE INSTRUCTIONS  °· Get plenty of rest, increase your fluids, and use warm compresses on the swollen area for 15 to 20 minutes 4 times per day or as often as feels good to you. °· Suck on hard candy or chew sugarless gum to promote saliva production. °· Only take over-the-counter or prescription medicines for pain, discomfort, or fever as directed by your caregiver. °SEEK IMMEDIATE MEDICAL CARE IF:  °· You have increased swelling or pain or pain not relieved with medications. °· You develop chills or a fever. °· Any of your problems are getting worse rather than better. °Document Released: 10/31/2004 Document Revised: 12/16/2011 Document Reviewed: 09/23/2005 °ExitCare® Patient Information ©2015 [employer]. This information is not intended to replace advice given to you by your health care provider. Make sure you discuss any questions you have with your health care provider. ° °

## 2019-11-17 ENCOUNTER — Other Ambulatory Visit: Payer: Self-pay

## 2019-11-18 ENCOUNTER — Ambulatory Visit: Payer: BC Managed Care – PPO | Admitting: Obstetrics and Gynecology

## 2019-11-18 ENCOUNTER — Encounter: Payer: Self-pay | Admitting: Obstetrics and Gynecology

## 2019-11-18 VITALS — BP 118/74 | Ht 60.0 in | Wt 147.0 lb

## 2019-11-18 DIAGNOSIS — Z01419 Encounter for gynecological examination (general) (routine) without abnormal findings: Secondary | ICD-10-CM | POA: Diagnosis not present

## 2019-11-18 DIAGNOSIS — Z862 Personal history of diseases of the blood and blood-forming organs and certain disorders involving the immune mechanism: Secondary | ICD-10-CM | POA: Diagnosis not present

## 2019-11-18 DIAGNOSIS — Z124 Encounter for screening for malignant neoplasm of cervix: Secondary | ICD-10-CM | POA: Diagnosis not present

## 2019-11-18 LAB — CBC
HCT: 38.9 % (ref 35.0–45.0)
Hemoglobin: 13 g/dL (ref 11.7–15.5)
MCH: 30.2 pg (ref 27.0–33.0)
MCHC: 33.4 g/dL (ref 32.0–36.0)
MCV: 90.3 fL (ref 80.0–100.0)
MPV: 10.2 fL (ref 7.5–12.5)
Platelets: 274 10*3/uL (ref 140–400)
RBC: 4.31 10*6/uL (ref 3.80–5.10)
RDW: 12 % (ref 11.0–15.0)
WBC: 6.6 10*3/uL (ref 3.8–10.8)

## 2019-11-18 NOTE — Patient Instructions (Addendum)
Please contact [facility] or [facility] to schedule a mammogram. Please contact one of the gastroenterology groups in town to set up a colonoscopy Make sure you are getting enough calcium and vitamin D for optimal bone health in your diet, and supplement with calcium to get 1200 mg/day, and vitamin D to get to at least 1000 IU/day.  Health Maintenance, Female Adopting a healthy lifestyle and getting preventive care are important in promoting health and wellness. Ask your health care provider about:  The right schedule for you to have regular tests and exams.  Things you can do on your own to prevent diseases and keep yourself healthy. What should I know about diet, weight, and exercise? Eat a healthy diet   Eat a diet that includes plenty of vegetables, fruits, low-fat dairy products, and lean protein.  Do not eat a lot of foods that are high in solid fats, added sugars, or sodium. Maintain a healthy weight Body mass index (BMI) is used to identify weight problems. It estimates body fat based on height and weight. Your health care provider can help determine your BMI and help you achieve or maintain a healthy weight. Get regular exercise Get regular exercise. This is one of the most important things you can do for your health. Most adults should:  Exercise for at least 150 minutes each week. The exercise should increase your heart rate and make you sweat (moderate-intensity exercise).  Do strengthening exercises at least twice a week. This is in addition to the moderate-intensity exercise.  Spend less time sitting. Even light physical activity can be beneficial. Watch cholesterol and blood lipids Have your blood tested for lipids and cholesterol at 54 years of age, then have this test every 5 years. Have your cholesterol levels checked more often if:  Your lipid or cholesterol levels are high.  You are older than 54 years of age.  You are at high risk for heart disease. What  should I know about cancer screening? Depending on your health history and family history, you may need to have cancer screening at various ages. This may include screening for:  Breast cancer.  Cervical cancer.  Colorectal cancer.  Skin cancer.  Lung cancer. What should I know about heart disease, diabetes, and high blood pressure? Blood pressure and heart disease  High blood pressure causes heart disease and increases the risk of stroke. This is more likely to develop in people who have high blood pressure readings, are of African descent, or are overweight.  Have your blood pressure checked: ? Every 3-5 years if you are 8-43 years of age. ? Every year if you are 23 years old or older. Diabetes Have regular diabetes screenings. This checks your fasting blood sugar level. Have the screening done:  Once every three years after age 101 if you are at a normal weight and have a low risk for diabetes.  More often and at a younger age if you are overweight or have a high risk for diabetes. What should I know about preventing infection? Hepatitis B If you have a higher risk for hepatitis B, you should be screened for this virus. Talk with your health care provider to find out if you are at risk for hepatitis B infection. Hepatitis C Testing is recommended for:  Everyone born from [birth year] through 1965.  Anyone with known risk factors for hepatitis C. Sexually transmitted infections (STIs)  Get screened for STIs, including gonorrhea and chlamydia, if: ? You are sexually active and  are younger than 54 years of age. ? You are older than 54 years of age and your health care provider tells you that you are at risk for this type of infection. ? Your sexual activity has changed since you were last screened, and you are at increased risk for chlamydia or gonorrhea. Ask your health care provider if you are at risk.  Ask your health care provider about whether you are at high risk for HIV. Your  health care provider may recommend a prescription medicine to help prevent HIV infection. If you choose to take medicine to prevent HIV, you should first get tested for HIV. You should then be tested every 3 months for as long as you are taking the medicine. Pregnancy  If you are about to stop having your period (premenopausal) and you may become pregnant, seek counseling before you get pregnant.  Take 400 to 800 micrograms (mcg) of folic acid every day if you become pregnant.  Ask for birth control (contraception) if you want to prevent pregnancy. Osteoporosis and menopause Osteoporosis is a disease in which the bones lose minerals and strength with aging. This can result in bone fractures. If you are 70 years old or older, or if you are at risk for osteoporosis and fractures, ask your health care provider if you should:  Be screened for bone loss.  Take a calcium or vitamin D supplement to lower your risk of fractures.  Be given hormone replacement therapy (HRT) to treat symptoms of menopause. Follow these instructions at home: Lifestyle  Do not use any products that contain nicotine or tobacco, such as cigarettes, e-cigarettes, and chewing tobacco. If you need help quitting, ask your health care provider.  Do not use street drugs.  Do not share needles.  Ask your health care provider for help if you need support or information about quitting drugs. Alcohol use  Do not drink alcohol if: ? Your health care provider tells you not to drink. ? You are pregnant, may be pregnant, or are planning to become pregnant.  If you drink alcohol: ? Limit how much you use to 0-1 drink a day. ? Limit intake if you are breastfeeding.  Be aware of how much alcohol is in your drink. In the [place] one drink equals one 12 oz bottle of beer (355 mL), one 5 oz glass of wine (148 mL), or one 1 oz glass of hard liquor (44 mL). General instructions  Schedule regular health, dental, and eye  exams.  Stay current with your vaccines.  Tell your health care provider if: ? You often feel depressed. ? You have ever been abused or do not feel safe at home. Summary  Adopting a healthy lifestyle and getting preventive care are important in promoting health and wellness.  Follow your health care provider's instructions about healthy diet, exercising, and getting tested or screened for diseases.  Follow your health care provider's instructions on monitoring your cholesterol and blood pressure. This information is not intended to replace advice given to you by your health care provider. Make sure you discuss any questions you have with your health care provider. Document Revised: 09/16/2018 Document Reviewed: 09/16/2018 Elsevier Patient Education  2020 [employer].

## 2019-11-18 NOTE — Addendum Note (Signed)
Addended by: Dayna Barker on: 11/18/2019 11:49 AM   Modules accepted: Orders

## 2019-11-18 NOTE — Progress Notes (Signed)
   Grace Lawrence September 26, 1966 [id number]  SUBJECTIVE:  54 y.o. G3P3 female for annual routine gynecologic exam and Pap smear.  Last seen in this office by Dr. Eda Paschal in 2012.  No well woman care since then.  She has recently had lipid screening through her work place.  She has no gynecologic concerns.  Current Outpatient Medications  Medication Sig Dispense Refill  . diphenhydrAMINE (BENADRYL) 25 MG tablet Take 25 mg by mouth every 6 (six) hours as needed.    . pseudoephedrine (SUDAFED) 60 MG tablet Take 60 mg by mouth every 4 (four) hours as needed.     No current facility-administered medications for this visit.   Allergies: Aspergillus fumigatus, Codeine, Erythromycin, and Sulfa antibiotics  Patient's last menstrual period was 08/01/2014.  Past medical history,surgical history, problem list, medications, allergies, family history and social history were all reviewed and documented as reviewed in the EPIC chart.  ROS:  Feeling well. No dyspnea or chest pain on exertion.  No abdominal pain, change in bowel habits, black or bloody stools.  No urinary tract symptoms. GYN ROS:  no abnormal bleeding, pelvic pain or discharge, no breast pain or new or enlarging lumps on self exam. No neurological complaints.   OBJECTIVE:  BP 118/74   Ht 5' (1.524 m)   Wt 147 lb (66.7 kg)   LMP 08/01/2014   BMI 28.71 kg/m  The patient appears well, alert, oriented x 3, in no distress. ENT normal.  Neck supple. No cervical or supraclavicular adenopathy or thyromegaly.  Lungs are clear, good air entry, no wheezes, rhonchi or rales. S1 and S2 normal, no murmurs, regular rate and rhythm.  Abdomen soft without tenderness, guarding, mass or organomegaly.  Neurological is normal, no focal findings.  BREAST EXAM: breasts appear normal, no suspicious masses, no skin or nipple changes or axillary nodes  PELVIC EXAM: VULVA: normal appearing vulva with no masses, tenderness or lesions, VAGINA: normal  appearing vagina with normal color and discharge, no lesions, CERVIX: normal appearing cervix without discharge or lesions, UTERUS: uterus is normal size, shape, consistency and nontender, ADNEXA: normal adnexa in size, nontender and no masses, RECTAL: normal rectal, no masses, PAP: Pap smear done today, thin-prep method  Chaperone: Kennon Portela present during the examination  ASSESSMENT:  54 y.o. G3P3 here for annual gynecologic exam  PLAN:   1. Postmenopausal. LMP about 6 years ago.  She has had no vaginal bleeding or significant vasomotor symptoms. 2. Pap smear 11/2010. Pap smear is repeated today. No prior history of abnormal Pap smears.  3. Mammogram 11/2010. Will continue with annual mammography. Breast exam normal today. 4. Colonoscopy never done. Recommended that she pursue getting this done this year, and references are provided.  We discussed the importance of colon cancer screening even if no significant family or personal history. 5. DEXA indicated by age 63.  It is important to take vitamin D and calcium to help keep bones strong. 6.  History of anemia.  Notes fatigue at times and does not know where she stands with hemoglobin count.  CBC ordered today. 7. Health maintenance.  She indicates lipid and thyroid screening labs have been completed at her workplace within the last 6 months.    Return annually or sooner, prn.  Theresia Majors MD  11/18/19

## 2019-11-22 LAB — PAP IG W/ RFLX HPV ASCU

## 2019-12-21 ENCOUNTER — Ambulatory Visit (INDEPENDENT_AMBULATORY_CARE_PROVIDER_SITE_OTHER): Payer: BC Managed Care – PPO

## 2019-12-21 ENCOUNTER — Encounter (HOSPITAL_COMMUNITY): Payer: Self-pay | Admitting: Emergency Medicine

## 2019-12-21 ENCOUNTER — Ambulatory Visit (HOSPITAL_COMMUNITY)
Admission: EM | Admit: 2019-12-21 | Discharge: 2019-12-21 | Disposition: A | Discharge status: Home / Self Care | Payer: BC Managed Care – PPO | Attending: Emergency Medicine | Admitting: Emergency Medicine

## 2019-12-21 ENCOUNTER — Other Ambulatory Visit: Payer: Self-pay

## 2019-12-21 DIAGNOSIS — M5134 Other intervertebral disc degeneration, thoracic region: Secondary | ICD-10-CM | POA: Diagnosis not present

## 2019-12-21 DIAGNOSIS — M47814 Spondylosis without myelopathy or radiculopathy, thoracic region: Secondary | ICD-10-CM | POA: Diagnosis not present

## 2019-12-21 DIAGNOSIS — R202 Paresthesia of skin: Secondary | ICD-10-CM | POA: Diagnosis not present

## 2019-12-21 DIAGNOSIS — M546 Pain in thoracic spine: Secondary | ICD-10-CM | POA: Diagnosis not present

## 2019-12-21 MED ORDER — TIZANIDINE HCL 4 MG PO TABS: 4.0000 mg | ORAL_TABLET | Freq: Three times a day (TID) | ORAL | 0 refills | Status: DC | PRN

## 2019-12-21 MED ORDER — METHYLPREDNISOLONE 4 MG PO TBPK: ORAL_TABLET | Freq: Every day | ORAL | 0 refills | Status: DC

## 2019-12-21 MED ORDER — MELOXICAM 15 MG PO TABS: 15.0000 mg | ORAL_TABLET | Freq: Every day | ORAL | 0 refills | Status: AC

## 2019-12-21 NOTE — ED Triage Notes (Signed)
Pt c/o back pain into arms with some numbness; pt sts arm pain, neck pain and foot pain x 2 months; pt sts has new job x 6 months working on [employer]; denies obvious injury

## 2019-12-21 NOTE — Discharge Instructions (Signed)
You have mild multilevel arthritis in your thoracic spine.  There is no fracture, metastasis or other worrisome findings.  Your symptoms could be aggravated by the work that you do.  See if you can do something else for a week or 2 to try and give the medications a chance to work.  Take the Mobic once a day.  Take at 1000 mg of Tylenol 3 or 4 times a day as needed for pain.  The Medrol Dosepak will help with swelling and also help with your hands, and the Zanaflex will help with any muscle spasms.  You can try wrist splints at night to see if it does not help with your hand pain.  With a primary care physician soon as you possibly can see list below  Below is a list of primary care practices who are taking new patients for you to follow-up with.  [facility] internal medicine clinic Ground Floor - [facility], [address] [place], [place], [place] [address] [phone]  [facility] at [facility] [address] [place], [place] [address] [phone]  [facility] [address] [place], [place] [address] [phone]  [facility] [phone] [address] [place] [address]  [facility]: [address] [place] [address]  [phone]  [facility]: [address] [place] [address]   [phone]  [facility]: [address] [place] [address]  [phone]   primary care : [address] [place] [address] [phone]  [facility]: [address] [place] [address] [phone]  [facility]: [address] [place] [address] [phone]  Dr. Oneal Grout [address] [facility] [facility] [place] [address]  [phone]  Dr. Jackie Plum, [facility]. [address] [place], [place] [address]  [phone]  Go to www.goodrx.com to look up your medications. This will give you a list of where you can find your prescriptions at the most affordable prices. Or ask the pharmacist what the cash price is, or if they have any other discount programs available to help make your medication more affordable. This can be less expensive than what you would pay with insurance.

## 2019-12-21 NOTE — ED Provider Notes (Signed)
HPI  SUBJECTIVE:  Grace Lawrence is a 53 y.o. female who presents with 2 issues.  First she reports over a year of mid thoracic midline back pain.  She describes the pain as constant, burning, stabbing like an ice pick.  States it radiates around her ribs.  Reports spasms described as "seizing up".  States the pain has gotten worse over the past week.  No unintentional weight loss night sweats trauma chest pain shortness of breath.  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  She has been at this job for 6 months.  She was having the pain prior to starting this job.  She states that she is not working more.  Denies change in physical activity.  She has tried 1 tablet of OTC Naprosyn twice a day and topical diclofenac without improvement in her symptoms.  She has also tried ibuprofen, popping her back and stretching.  She states symptoms are better with hot shower, heating pad and a firm mattress.  Symptoms are worse with torso rotation, sitting for prolonged periods of time.    Second she also reports bilateral hand numbness starting years ago.  She states that she has had 4 months of bilateral burning hand pain, joint swelling and achy pain.  States that she cannot close her hands into fists when she wakes up in the morning.  She states that her hands loosen up after she warms up.  She denies waking up at night with her hands clenched or her wrists flexed.  No grip weakness.  She has tried Naprosyn and soaking her hands in warm water.  The warm water helps.  No aggravating factors.  Past medical history  of cervical spine fracture that healed on its own, gestational diabetes.  No history of cancer multiple myeloma osteoporosis prolonged steroid use, carpal tunnel syndrome peripheral neuropathy, hypertension, liver disease chronic kidney disease rheumatoid arthritis osteoarthritis GI bleed.  PMD: None.    Past Medical History:  Diagnosis Date  . Allergy   .  Anemia   . Heart murmur   . IBS (irritable bowel syndrome)     Past Surgical History:  Procedure Laterality Date  . CESAREAN SECTION      Family History  Problem Relation Age of Onset  . Diabetes Mother   . Cancer Father        Kidney  . Cancer Sister        ? type    Social History   Tobacco Use  . Smoking status: Never Smoker  . Smokeless tobacco: Never Used  Substance Use Topics  . Alcohol use: Never  . Drug use: No    No current facility-administered medications for this encounter.  Current Outpatient Medications:  .  diphenhydrAMINE (BENADRYL) 25 MG tablet, Take 25 mg by mouth every 6 (six) hours as needed., Disp: , Rfl:  .  meloxicam (MOBIC) 15 MG tablet, Take 1 tablet (15 mg total) by mouth daily for 7 days., Disp: 7 tablet, Rfl: 0 .  methylPREDNISolone (MEDROL DOSEPAK) 4 MG TBPK tablet, Take by mouth daily. Follow package instructions, Disp: 21 tablet, Rfl: 0 .  tiZANidine (ZANAFLEX) 4 MG tablet, Take 1 tablet (4 mg total) by mouth every 8 (eight) hours as needed for muscle spasms., Disp: 30 tablet, Rfl: 0  Allergies  Allergen Reactions  . Aspergillus Fumigatus   . Codeine Itching  . Erythromycin Nausea And Vomiting and Swelling  . Sulfa Antibiotics Other (See   Comments)    child     ROS  As noted in HPI.   Physical Exam  BP 139/82 (BP Location: Right Arm)   Pulse 68   Temp 98.3 F (36.8 C) (Oral)   Resp 18   LMP 08/01/2014   SpO2 100%   Constitutional: Well developed, well nourished, no acute distress Eyes:  EOMI, conjunctiva normal bilaterally HENT: Normocephalic, atraumatic,mucus membranes moist Respiratory: Normal inspiratory effort lungs clear bilaterally. Cardiovascular: Normal rate GI: nondistended skin: No rash, skin intact Musculoskeletal: There is tenderness upper T-spine.  No C-spine L-spine tenderness.  Positive bilateral muscular tenderness over the upper mid thoracic area T3-T4-T5 area. no appreciable muscle spasm.  Wrist  nontender, FROM without any problem or pain.  Motor and sensation intact in the median/radial/ulnar distribution.  Normal appearance of the hands.  Negative Tinel.  Positive Phalen. Neurologic: Alert & oriented x 3, no focal neuro deficits Psychiatric: Speech and behavior appropriate   ED Course   Medications - No data to display  Orders Placed This Encounter  Procedures  . DG Thoracic Spine 2 View    Standing Status:   Standing    Number of Occurrences:   1    Order Specific Question:   Reason for Exam (SYMPTOM  OR DIAGNOSIS REQUIRED)    Answer:   upper midline thoracic bony tenderness r/o compression fx, mets, DDD    Order Specific Question:   Is patient pregnant?    Answer:   No    No results found for this or any previous visit (from the past 24 hour(s)). DG Thoracic Spine 2 View  Result Date: 12/21/2019 CLINICAL DATA:  Upper midline thoracic bony tenderness, rule out compression fracture, Mets, disc degenerative disease EXAM: THORACIC SPINE 2 VIEWS COMPARISON:  None. FINDINGS: No fracture or dislocation of the thoracic spine. Mild multilevel disc space height loss and osteophytosis. No obvious osseous lytic or sclerotic lesions. The included chest is unremarkable. IMPRESSION: Mild multilevel disc space height loss and osteophytosis of the thoracic spine. No acute radiographic findings to explain thoracic bony tenderness. Electronically Signed   By: Alex  Bibbey M.D.   On: 12/21/2019 14:38    ED Clinical Impression  1. Osteoarthritis of thoracic spine, unspecified spinal osteoarthritis complication status   2. Paresthesias      ED Assessment/Plan  1.  Thoracic tenderness.  Could be compression fracture, Osteoarthritis, degenerative disc disease, metastasis, spinal stenosis, overuse syndrome.  Will get T-spine films to rule out compression fracture, metastasis, degenerative disc disease.  Reviewed imaging independently.  Mild multilevel disc space height loss and osteophytosis  of the thoracic spine no acute radiographic findings.  See radiology report for details.  Suspect osteoarthritis.  Mobic, Tylenol.  2.  Bilateral hand paresthesias, joint swelling.  Patient appears to have a component of carpal tunnel syndrome with a positive Phalen.  She has years of repetitive motion with her wrists and typing.  However wonder if she does not have rheumatoid arthritis given that she reports joint swelling, stiffness in the morning that improves with use.    Will have her try bilateral cock-up wrist splints, Medrol Dosepak.  Mobic 15 mg once daily with 1000 mg of Tylenol 3-4 times a day as needed for pain.    Will refer her to a primary care physician for ongoing management and further diagnostic work-up.  Discussed imaging, MDM, treatment plan, and plan for follow-up with patient. Discussed sn/sx that should prompt return to the ED. patient agrees with plan.     Meds ordered this encounter  Medications  . meloxicam (MOBIC) 15 MG tablet    Sig: Take 1 tablet (15 mg total) by mouth daily for 7 days.    Dispense:  7 tablet    Refill:  0  . tiZANidine (ZANAFLEX) 4 MG tablet    Sig: Take 1 tablet (4 mg total) by mouth every 8 (eight) hours as needed for muscle spasms.    Dispense:  30 tablet    Refill:  0  . methylPREDNISolone (MEDROL DOSEPAK) 4 MG TBPK tablet    Sig: Take by mouth daily. Follow package instructions    Dispense:  21 tablet    Refill:  0    *This clinic note was created using Dragon dictation software. Therefore, there may be occasional mistakes despite careful proofreading.   ?   Melynda Ripple, MD 12/21/19 [phone]

## 2020-01-04 ENCOUNTER — Ambulatory Visit: Payer: BC Managed Care – PPO | Admitting: Registered Nurse

## 2020-01-04 ENCOUNTER — Encounter: Payer: Self-pay | Admitting: Registered Nurse

## 2020-01-04 ENCOUNTER — Other Ambulatory Visit: Payer: Self-pay

## 2020-01-04 VITALS — BP 108/76 | HR 83 | Temp 98.0°F | Resp 18 | Ht 60.0 in | Wt 149.8 lb

## 2020-01-04 DIAGNOSIS — M79641 Pain in right hand: Secondary | ICD-10-CM | POA: Diagnosis not present

## 2020-01-04 DIAGNOSIS — Z13220 Encounter for screening for lipoid disorders: Secondary | ICD-10-CM | POA: Diagnosis not present

## 2020-01-04 DIAGNOSIS — Z13228 Encounter for screening for other metabolic disorders: Secondary | ICD-10-CM | POA: Diagnosis not present

## 2020-01-04 DIAGNOSIS — Z130 Encounter for screening for diseases of the blood and blood-forming organs and certain disorders involving the immune mechanism: Secondary | ICD-10-CM | POA: Diagnosis not present

## 2020-01-04 DIAGNOSIS — Z1329 Encounter for screening for other suspected endocrine disorder: Secondary | ICD-10-CM | POA: Diagnosis not present

## 2020-01-04 DIAGNOSIS — M549 Dorsalgia, unspecified: Secondary | ICD-10-CM | POA: Diagnosis not present

## 2020-01-04 DIAGNOSIS — G8929 Other chronic pain: Secondary | ICD-10-CM

## 2020-01-04 DIAGNOSIS — M79642 Pain in left hand: Secondary | ICD-10-CM

## 2020-01-04 DIAGNOSIS — M546 Pain in thoracic spine: Secondary | ICD-10-CM

## 2020-01-04 LAB — POCT URINALYSIS DIP (CLINITEK)
Bilirubin, UA: NEGATIVE
Blood, UA: NEGATIVE
Glucose, UA: NEGATIVE mg/dL
Ketones, POC UA: NEGATIVE mg/dL
Nitrite, UA: NEGATIVE
POC PROTEIN,UA: NEGATIVE
Spec Grav, UA: >=1.03 — AB (ref 1.010–1.025)
Urobilinogen, UA: 0.2 E.U./dL
pH, UA: 5.5 (ref 5.0–8.0)

## 2020-01-04 MED ORDER — DICLOFENAC SODIUM 75 MG PO TBEC: 75.0000 mg | DELAYED_RELEASE_TABLET | Freq: Two times a day (BID) | ORAL | 0 refills | Status: DC

## 2020-01-04 MED ORDER — METHOCARBAMOL 500 MG PO TABS: 500.0000 mg | ORAL_TABLET | Freq: Four times a day (QID) | ORAL | 0 refills | Status: DC

## 2020-01-04 NOTE — Patient Instructions (Signed)
° ° ° °  If you have lab work done today you will be contacted with your lab results within the next 2 weeks.  If you have not heard from us then please contact us. The fastest way to get your results is to register for My Chart. ° ° °IF you received an x-ray today, you will receive an invoice from [facility]. Please contact [facility] at [phone] with questions or concerns regarding your invoice.  ° °IF you received labwork today, you will receive an invoice from [facility]. Please contact [facility] at [phone] with questions or concerns regarding your invoice.  ° °Our billing staff will not be able to assist you with questions regarding bills from these companies. ° °You will be contacted with the lab results as soon as they are available. The fastest way to get your results is to activate your My Chart account. Instructions are located on the last page of this paperwork. If you have not heard from us regarding the results in 2 weeks, please contact this office. °  ° ° ° °

## 2020-01-04 NOTE — Progress Notes (Signed)
.  BW

## 2020-01-05 LAB — CBC WITH DIFFERENTIAL/PLATELET
Basophils Absolute: 0 10*3/uL (ref 0.0–0.2)
Basos: 1 %
EOS (ABSOLUTE): 0.2 10*3/uL (ref 0.0–0.4)
Eos: 2 %
Hematocrit: 39 % (ref 34.0–46.6)
Hemoglobin: 13.2 g/dL (ref 11.1–15.9)
Immature Grans (Abs): 0 10*3/uL (ref 0.0–0.1)
Immature Granulocytes: 0 %
Lymphocytes Absolute: 2.5 10*3/uL (ref 0.7–3.1)
Lymphs: 31 %
MCH: 30.9 pg (ref 26.6–33.0)
MCHC: 33.8 g/dL (ref 31.5–35.7)
MCV: 91 fL (ref 79–97)
Monocytes Absolute: 0.6 10*3/uL (ref 0.1–0.9)
Monocytes: 7 %
Neutrophils Absolute: 4.7 10*3/uL (ref 1.4–7.0)
Neutrophils: 59 %
Platelets: 272 10*3/uL (ref 150–450)
RBC: 4.27 x10E6/uL (ref 3.77–5.28)
RDW: 12.6 % (ref 11.7–15.4)
WBC: 7.9 10*3/uL (ref 3.4–10.8)

## 2020-01-05 LAB — LIPID PANEL
Chol/HDL Ratio: 3.5 ratio (ref 0.0–4.4)
Cholesterol, Total: 161 mg/dL (ref 100–199)
HDL: 46 mg/dL (ref >39)
LDL Chol Calc (NIH): 92 mg/dL (ref 0–99)
Triglycerides: 130 mg/dL (ref 0–149)
VLDL Cholesterol Cal: 23 mg/dL (ref 5–40)

## 2020-01-05 LAB — COMPREHENSIVE METABOLIC PANEL
ALT: 19 IU/L (ref 0–32)
AST: 16 IU/L (ref 0–40)
Albumin/Globulin Ratio: 1.6 (ref 1.2–2.2)
Albumin: 4.4 g/dL (ref 3.8–4.9)
Alkaline Phosphatase: 70 IU/L (ref 39–117)
BUN/Creatinine Ratio: 24 — ABNORMAL HIGH (ref 9–23)
BUN: 17 mg/dL (ref 6–24)
Bilirubin Total: <0.2 mg/dL (ref 0.0–1.2)
CO2: 20 mmol/L (ref 20–29)
Calcium: 9.2 mg/dL (ref 8.7–10.2)
Chloride: 107 mmol/L — ABNORMAL HIGH (ref 96–106)
Creatinine, Ser: 0.7 mg/dL (ref 0.57–1.00)
GFR calc Af Amer: 114 mL/min/{1.73_m2} (ref >59)
GFR calc non Af Amer: 99 mL/min/{1.73_m2} (ref >59)
Globulin, Total: 2.8 g/dL (ref 1.5–4.5)
Glucose: 109 mg/dL — ABNORMAL HIGH (ref 65–99)
Potassium: 4 mmol/L (ref 3.5–5.2)
Sodium: 143 mmol/L (ref 134–144)
Total Protein: 7.2 g/dL (ref 6.0–8.5)

## 2020-01-05 LAB — SEDIMENTATION RATE: Sed Rate: 25 mm/hr (ref 0–40)

## 2020-01-05 LAB — ANA: Anti Nuclear Antibody (ANA): NEGATIVE

## 2020-01-05 LAB — TSH: TSH: 2.54 u[IU]/mL (ref 0.450–4.500)

## 2020-01-05 LAB — HEMOGLOBIN A1C
Est. average glucose Bld gHb Est-mCnc: 114 mg/dL
Hgb A1c MFr Bld: 5.6 % (ref 4.8–5.6)

## 2020-01-05 LAB — C-REACTIVE PROTEIN: CRP: 4 mg/L (ref 0–10)

## 2020-01-11 ENCOUNTER — Encounter: Payer: Self-pay | Admitting: Registered Nurse

## 2020-01-16 ENCOUNTER — Encounter: Payer: Self-pay | Admitting: Registered Nurse

## 2020-01-16 NOTE — Progress Notes (Signed)
New Patient Office Visit  Subjective:  Patient ID: Grace Lawrence, female    DOB: [birth year]  Age: 54 y.o. MRN: [id number]  CC:  Chief Complaint  Patient presents with  . New Patient (Initial Visit)    establish care . patient states she has been having back problems and feels like it feels like its muscle spasms but wants to know what she can take because she is currently working 12 hours shifts standing . went to urgent care and tylenol doesn't seem to help    HPI Grace Lawrence presents for visit to establish care C/o back pain  Works 12 hour shifts, standing, moving, has thoracic spine pain, feels some muscular pain as well, labels as "spasms". Has taken OTC analgesics for this without relief. Has been seen by urgent care, where she felt no progress was made. Wants to be able to work and is consistent in declining any sedating medications.   Past Medical History:  Diagnosis Date  . Allergy   . Anemia   . Heart murmur   . IBS (irritable bowel syndrome)     Past Surgical History:  Procedure Laterality Date  . CESAREAN SECTION      Family History  Problem Relation Age of Onset  . Diabetes Mother   . Cancer Father        Kidney  . Cancer Sister        ? type    Social History   Socioeconomic History  . Marital status: Single    Spouse name: Not on file  . Number of children: Not on file  . Years of education: Not on file  . Highest education level: Not on file  Occupational History  . Not on file  Tobacco Use  . Smoking status: Never Smoker  . Smokeless tobacco: Never Used  Substance and Sexual Activity  . Alcohol use: Never  . Drug use: No  . Sexual activity: Yes    Comment: 1st intercourse 54 yo-More than 5 partners  Other Topics Concern  . Not on file  Social History Narrative  . Not on file   Social Determinants of Health   Financial Resource Strain:   . Difficulty of Paying Living Expenses:   Food Insecurity:   . Worried About  Programme researcher, broadcasting/film/video in the Last Year:   . Barista in the Last Year:   Transportation Needs:   . Freight forwarder (Medical):   Marland Kitchen Lack of Transportation (Non-Medical):   Physical Activity:   . Days of Exercise per Week:   . Minutes of Exercise per Session:   Stress:   . Feeling of Stress :   Social Connections:   . Frequency of Communication with Friends and Family:   . Frequency of Social Gatherings with Friends and Family:   . Attends Religious Services:   . Active Member of Clubs or Organizations:   . Attends Banker Meetings:   Marland Kitchen Marital Status:   Intimate Partner Violence:   . Fear of Current or Ex-Partner:   . Emotionally Abused:   Marland Kitchen Physically Abused:   . Sexually Abused:     ROS Review of Systems  Constitutional: Negative.   HENT: Negative.   Eyes: Negative.   Respiratory: Negative.   Cardiovascular: Negative.   Gastrointestinal: Negative.   Endocrine: Negative.   Genitourinary: Negative.   Musculoskeletal: Positive for back pain and myalgias. Negative for arthralgias, gait problem, joint swelling, neck pain and  neck stiffness.  Skin: Negative.   Allergic/Immunologic: Negative.   Neurological: Negative.   Hematological: Negative.   Psychiatric/Behavioral: Negative.   All other systems reviewed and are negative.   Objective:   Today's Vitals: BP 108/76   Pulse 83   Temp 98 F (36.7 C) (Temporal)   Resp 18   Ht 5' (1.524 m)   Wt 149 lb 12.8 oz (67.9 kg)   LMP 08/01/2014   SpO2 95%   BMI 29.26 kg/m   Physical Exam Vitals and nursing note reviewed.  Constitutional:      General: She is not in acute distress.    Appearance: Normal appearance. She is normal weight. She is not ill-appearing, toxic-appearing or diaphoretic.  Cardiovascular:     Rate and Rhythm: Normal rate and regular rhythm.     Heart sounds: Normal heart sounds. No murmur. No friction rub. No gallop.   Pulmonary:     Effort: Pulmonary effort is normal. No  respiratory distress.     Breath sounds: Normal breath sounds. No stridor. No wheezing, rhonchi or rales.  Chest:     Chest wall: No tenderness.  Musculoskeletal:        General: Tenderness (mild, thoracic spine and surrounding musculature) present. No swelling, deformity or signs of injury. Normal range of motion.     Right lower leg: No edema.     Left lower leg: No edema.  Skin:    General: Skin is warm and dry.     Capillary Refill: Capillary refill takes less than 2 seconds.     Coloration: Skin is not jaundiced or pale.     Findings: No bruising, erythema, lesion or rash.  Neurological:     General: No focal deficit present.     Mental Status: She is alert and oriented to person, place, and time. Mental status is at baseline.  Psychiatric:        Mood and Affect: Mood normal.        Behavior: Behavior normal.        Thought Content: Thought content normal.        Judgment: Judgment normal.     Assessment & Plan:   Problem List Items Addressed This Visit    None    Visit Diagnoses    Back pain, unspecified back location, unspecified back pain laterality, unspecified chronicity    -  Primary   Relevant Medications   diclofenac (VOLTAREN) 75 MG EC tablet   methocarbamol (ROBAXIN) 500 MG tablet   Other Relevant Orders   POCT URINALYSIS DIP (CLINITEK) (Completed)   Screening for endocrine, metabolic and immunity disorder       Relevant Orders   CBC with Differential (Completed)   TSH (Completed)   Comprehensive metabolic panel (Completed)   Hemoglobin A1c (Completed)   Lipid screening       Relevant Orders   Lipid panel (Completed)   Bilateral hand pain       Relevant Orders   ANA (Completed)   Sedimentation Rate (Completed)   C-reactive protein (Completed)   Chronic midline thoracic back pain       Relevant Medications   diclofenac (VOLTAREN) 75 MG EC tablet   methocarbamol (ROBAXIN) 500 MG tablet   Other Relevant Orders   Ambulatory referral to Physical  Therapy      Outpatient Encounter Medications as of 01/04/2020  Medication Sig  . diphenhydrAMINE (BENADRYL) 25 MG tablet Take 25 mg by mouth every 6 (six) hours as needed.  Marland Kitchen  methylPREDNISolone (MEDROL DOSEPAK) 4 MG TBPK tablet Take by mouth daily. Follow package instructions  . tiZANidine (ZANAFLEX) 4 MG tablet Take 1 tablet (4 mg total) by mouth every 8 (eight) hours as needed for muscle spasms.  . diclofenac (VOLTAREN) 75 MG EC tablet Take 1 tablet (75 mg total) by mouth 2 (two) times daily.  . methocarbamol (ROBAXIN) 500 MG tablet Take 1 tablet (500 mg total) by mouth 4 (four) times daily.   No facility-administered encounter medications on file as of 01/04/2020.    Follow-up: No follow-ups on file.   PLAN  Reviewed imaging of lumbar spine  Diclofenac 75mg  PO bid prn fo rpain  Methocarbamol 500mg  PO qid PRN for muscle spasm  Refer to PT for treatment  Return to clinic prn  Patient encouraged to call clinic with any questions, comments, or concerns.  , NP

## 2020-05-05 ENCOUNTER — Telehealth: Payer: Self-pay | Admitting: Registered Nurse

## 2020-05-05 NOTE — Telephone Encounter (Signed)
Referral Followup °

## 2020-08-15 ENCOUNTER — Encounter: Payer: Self-pay | Admitting: *Deleted

## 2020-12-13 ENCOUNTER — Other Ambulatory Visit: Payer: Self-pay

## 2020-12-13 ENCOUNTER — Encounter: Payer: Self-pay | Admitting: Registered Nurse

## 2020-12-13 ENCOUNTER — Ambulatory Visit: Payer: Self-pay | Admitting: Registered Nurse

## 2020-12-13 VITALS — BP 123/79 | HR 100 | Temp 98.0°F | Resp 18 | Ht 60.0 in | Wt 152.6 lb

## 2020-12-13 DIAGNOSIS — R21 Rash and other nonspecific skin eruption: Secondary | ICD-10-CM

## 2020-12-13 MED ORDER — PREDNISONE 10 MG PO TABS: ORAL_TABLET | ORAL | 0 refills | Status: AC

## 2020-12-13 MED ORDER — TRIAMCINOLONE ACETONIDE 0.1 % EX CREA: 1.0000 "application " | TOPICAL_CREAM | Freq: Two times a day (BID) | CUTANEOUS | 0 refills | Status: DC

## 2020-12-13 NOTE — Patient Instructions (Signed)
° ° ° °  If you have lab work done today you will be contacted with your lab results within the next 2 weeks.  If you have not heard from us then please contact us. The fastest way to get your results is to register for My Chart. ° ° °IF you received an x-ray today, you will receive an invoice from [facility]. Please contact [facility] at [phone] with questions or concerns regarding your invoice.  ° °IF you received labwork today, you will receive an invoice from [facility]. Please contact [facility] at [phone] with questions or concerns regarding your invoice.  ° °Our billing staff will not be able to assist you with questions regarding bills from these companies. ° °You will be contacted with the lab results as soon as they are available. The fastest way to get your results is to activate your My Chart account. Instructions are located on the last page of this paperwork. If you have not heard from us regarding the results in 2 weeks, please contact this office. °  ° ° ° °

## 2021-07-22 IMAGING — DX DG THORACIC SPINE 2V
2 series · 2 of 2 positions shown · non-contrast
Comparison: None.

CLINICAL DATA: Upper midline thoracic bony tenderness, rule out
compression fracture, Mets, disc degenerative disease

EXAM:
THORACIC SPINE 2 VIEWS

[t-spine ap]
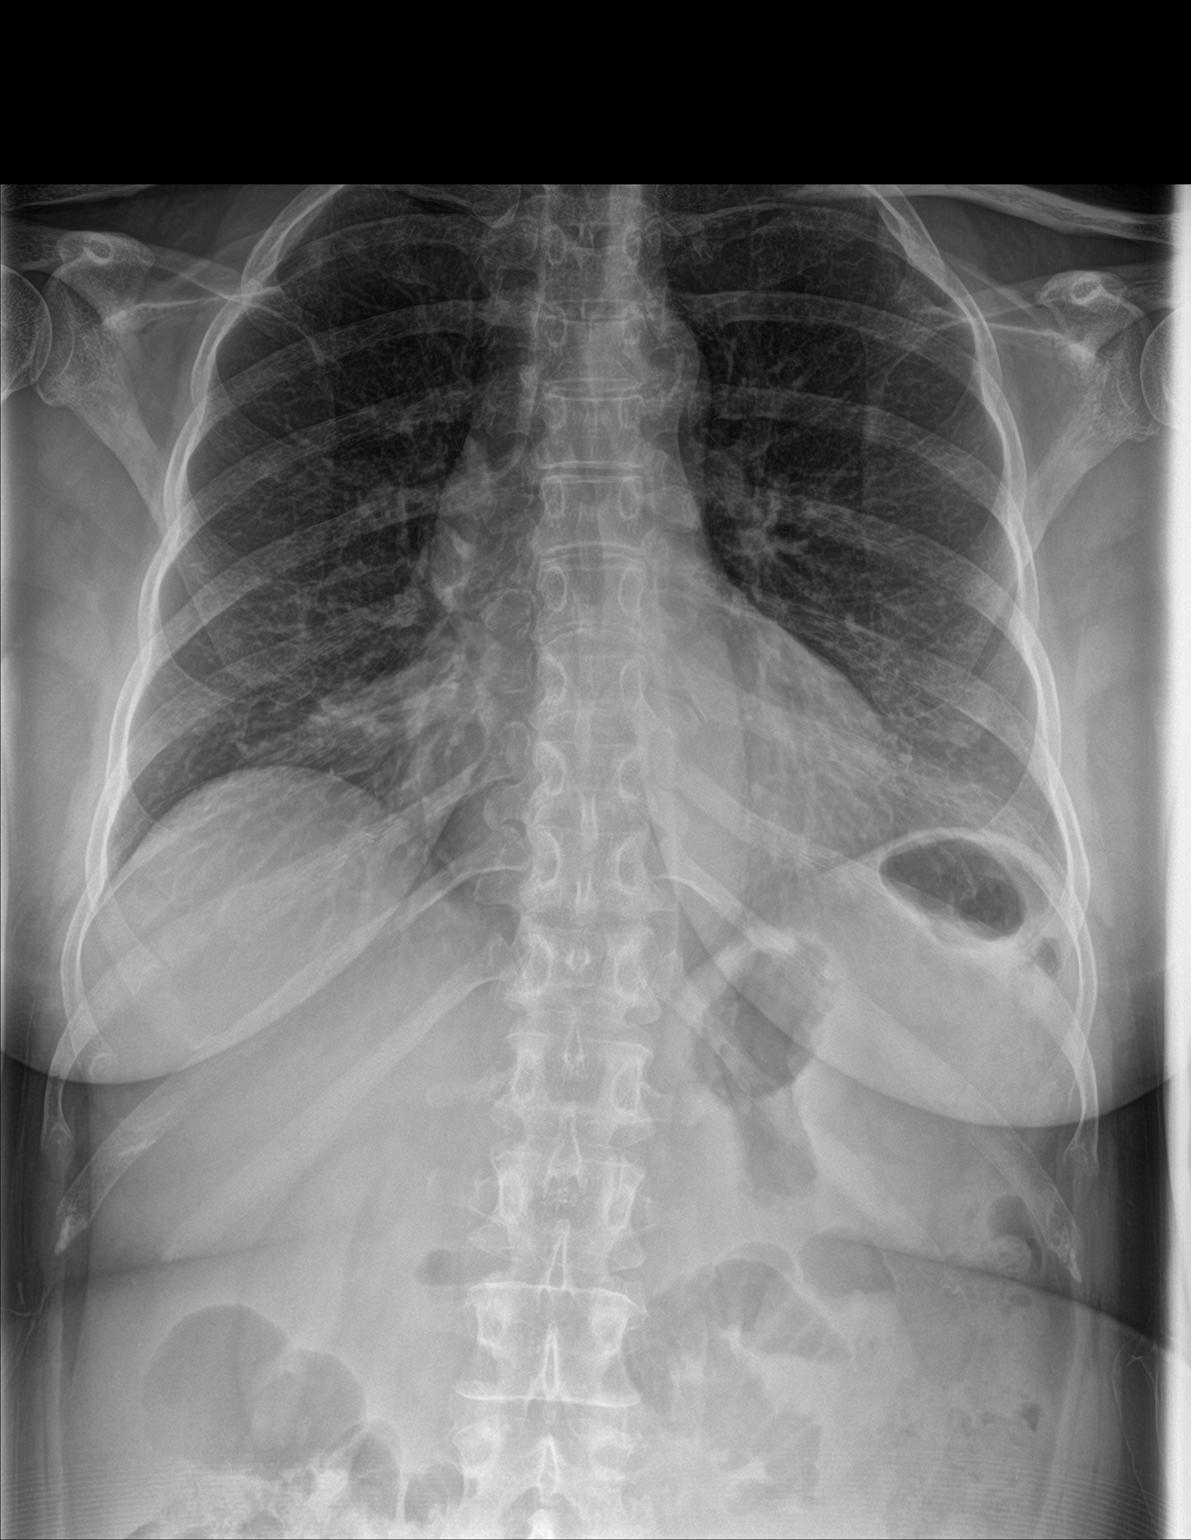

[t-spine lat]
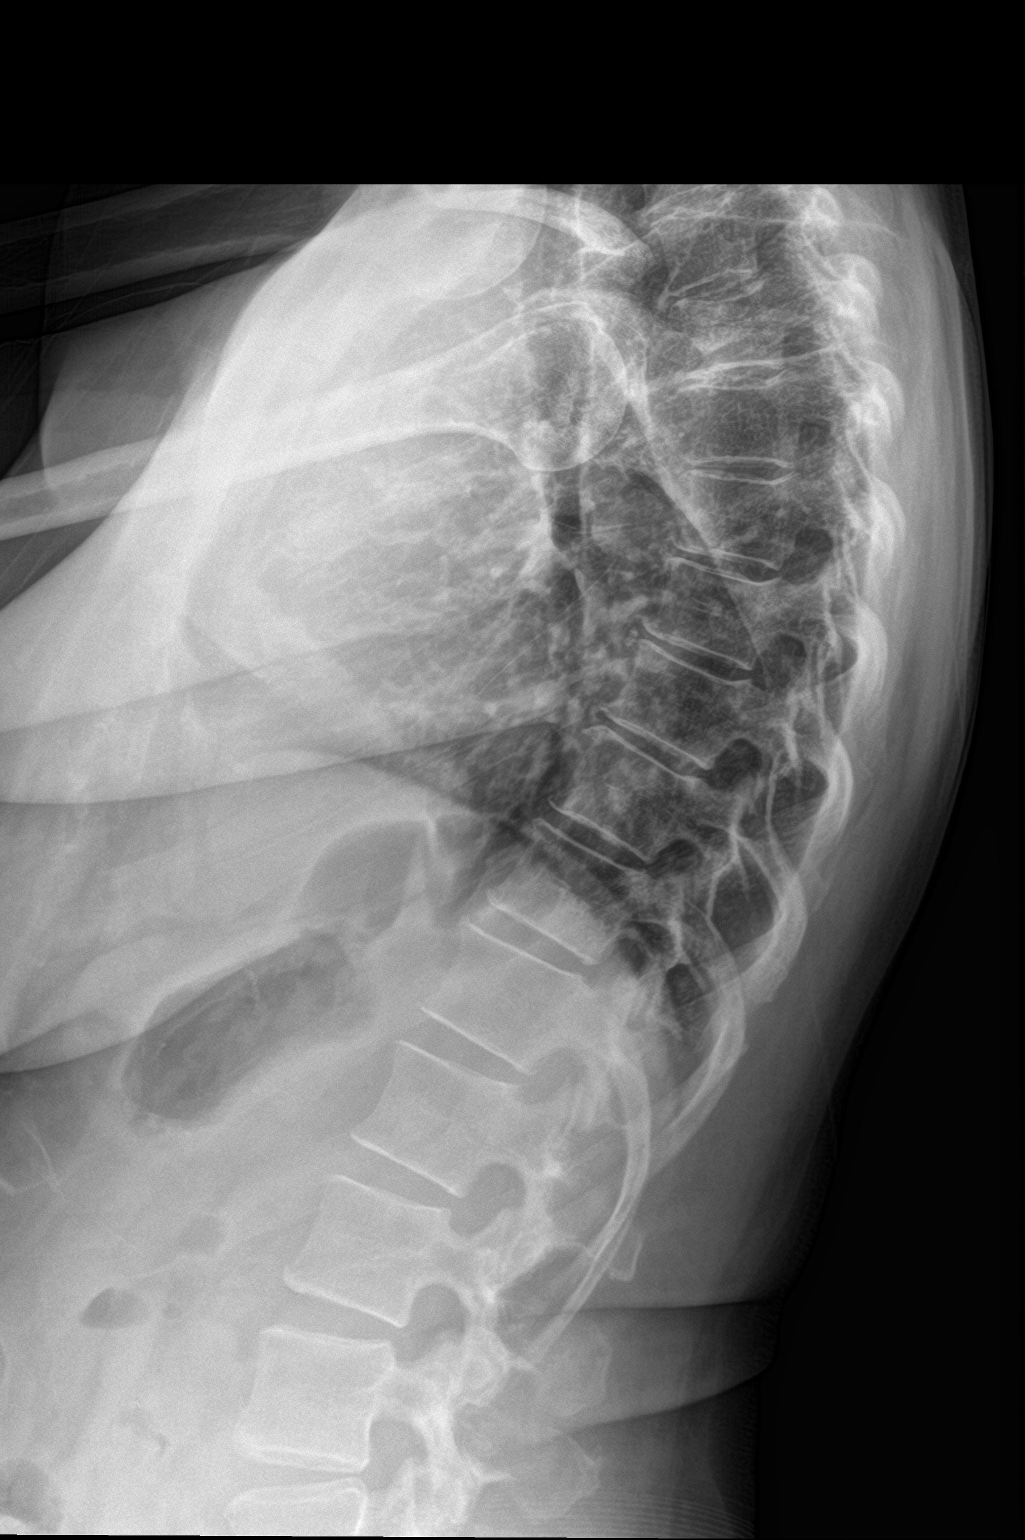

[2 of 2 positions shown; findings below may reference images not displayed]

FINDINGS: No fracture or dislocation of the thoracic spine. Mild multilevel
disc space height loss and osteophytosis. No obvious osseous lytic
or sclerotic lesions. The included chest is unremarkable.
IMPRESSION: Mild multilevel disc space height loss and osteophytosis of the
thoracic spine. No acute radiographic findings to explain thoracic
bony tenderness.

## 2021-08-17 NOTE — Progress Notes (Signed)
Established Patient Office Visit  Subjective:  Patient ID: Grace Lawrence, female    DOB: 1965/12/05  Age: 55 y.o. MRN: [id number]  CC:  Chief Complaint  Patient presents with   Rash    Patient states she has a rash on her right arm that she thinks it is a reaction to a body spray or her eczema.    HPI Grace Lawrence presents for rash  R arm  Red, flat, some flaking, itching Reaction to new body spray? Otherwise no new products Hx of eczema as well Has not tried any tx  No other symptoms or concerns.   Past Medical History:  Diagnosis Date   Allergy    Anemia    Heart murmur    IBS (irritable bowel syndrome)     Past Surgical History:  Procedure Laterality Date   CESAREAN SECTION      Family History  Problem Relation Age of Onset   Diabetes Mother    Cancer Father        Kidney   Cancer Sister        ? type    Social History   Socioeconomic History   Marital status: Single    Spouse name: Not on file   Number of children: Not on file   Years of education: Not on file   Highest education level: Not on file  Occupational History   Not on file  Tobacco Use   Smoking status: Never   Smokeless tobacco: Never  Vaping Use   Vaping Use: Never used  Substance and Sexual Activity   Alcohol use: Never   Drug use: No   Sexual activity: Yes    Comment: 1st intercourse 55 yo-More than 5 partners  Other Topics Concern   Not on file  Social History Narrative   Not on file   Social Determinants of Health   Financial Resource Strain: Not on file  Food Insecurity: Not on file  Transportation Needs: Not on file  Physical Activity: Not on file  Stress: Not on file  Social Connections: Not on file  Intimate Partner Violence: Not on file    Outpatient Medications Prior to Visit  Medication Sig Dispense Refill   diphenhydrAMINE (BENADRYL) 25 MG tablet Take 25 mg by mouth every 6 (six) hours as needed.     fexofenadine-pseudoephedrine  (ALLEGRA-D 24) 180-240 MG 24 hr tablet Take 1 tablet by mouth daily.     Pseudoephedrine HCl (SUDAFED 24 HOUR PO) Take by mouth.     diclofenac (VOLTAREN) 75 MG EC tablet Take 1 tablet (75 mg total) by mouth 2 (two) times daily. (Patient not taking: Reported on 12/13/2020) 90 tablet 0   methocarbamol (ROBAXIN) 500 MG tablet Take 1 tablet (500 mg total) by mouth 4 (four) times daily. (Patient not taking: Reported on 12/13/2020) 90 tablet 0   methylPREDNISolone (MEDROL DOSEPAK) 4 MG TBPK tablet Take by mouth daily. Follow package instructions (Patient not taking: Reported on 12/13/2020) 21 tablet 0   tiZANidine (ZANAFLEX) 4 MG tablet Take 1 tablet (4 mg total) by mouth every 8 (eight) hours as needed for muscle spasms. (Patient not taking: Reported on 12/13/2020) 30 tablet 0   No facility-administered medications prior to visit.    Allergies  Allergen Reactions   Aspergillus Fumigatus    Codeine Itching   Erythromycin Nausea And Vomiting and Swelling   Sulfa Antibiotics Other (See Comments)    child    ROS Review of Systems  Constitutional:  Negative.   HENT: Negative.    Eyes: Negative.   Respiratory: Negative.    Cardiovascular: Negative.   Gastrointestinal: Negative.   Genitourinary: Negative.   Musculoskeletal: Negative.   Skin:  Positive for rash. Negative for color change, pallor and wound.  Neurological: Negative.   Psychiatric/Behavioral: Negative.    All other systems reviewed and are negative.    Objective:    Physical Exam Vitals and nursing note reviewed.  Constitutional:      General: She is not in acute distress.    Appearance: Normal appearance. She is normal weight. She is not ill-appearing, toxic-appearing or diaphoretic.  Cardiovascular:     Rate and Rhythm: Normal rate and regular rhythm.     Heart sounds: Normal heart sounds. No murmur heard.   No friction rub. No gallop.  Pulmonary:     Effort: Pulmonary effort is normal. No respiratory distress.     Breath  sounds: Normal breath sounds. No stridor. No wheezing, rhonchi or rales.  Chest:     Chest wall: No tenderness.  Skin:    General: Skin is warm and dry.     Findings: Rash (red flat well circumscribed R dorsal forearm) present.  Neurological:     General: No focal deficit present.     Mental Status: She is alert and oriented to person, place, and time. Mental status is at baseline.  Psychiatric:        Mood and Affect: Mood normal.        Behavior: Behavior normal.        Thought Content: Thought content normal.        Judgment: Judgment normal.    BP 123/79   Pulse 100   Temp 98 F (36.7 C) (Temporal)   Resp 18   Ht 5' (1.524 m)   Wt 152 lb 9.6 oz (69.2 kg)   LMP 08/01/2014   SpO2 98%   BMI 29.80 kg/m  Wt Readings from Last 3 Encounters:  12/13/20 152 lb 9.6 oz (69.2 kg)  01/04/20 149 lb 12.8 oz (67.9 kg)  11/18/19 147 lb (66.7 kg)     Health Maintenance Due  Topic Date Due   COVID-19 Vaccine (1) Never done   Pneumococcal Vaccine 67-59 Years old (1 - PCV) Never done   HIV Screening  Never done   TETANUS/TDAP  Never done   Zoster Vaccines- Shingrix (1 of 2) Never done   COLONOSCOPY (Pts 45-77yrs Insurance coverage will need to be confirmed)  Never done   PAP SMEAR-Modifier  11/26/2013   MAMMOGRAM  07/04/2016   INFLUENZA VACCINE  Never done    There are no preventive care reminders to display for this patient.  Lab Results  Component Value Date   TSH 2.540 01/04/2020   Lab Results  Component Value Date   WBC 7.9 01/04/2020   HGB 13.2 01/04/2020   HCT 39.0 01/04/2020   MCV 91 01/04/2020   PLT 272 01/04/2020   Lab Results  Component Value Date   NA 143 01/04/2020   K 4.0 01/04/2020   CO2 20 01/04/2020   GLUCOSE 109 (H) 01/04/2020   BUN 17 01/04/2020   CREATININE 0.70 01/04/2020   BILITOT <0.2 01/04/2020   ALKPHOS 70 01/04/2020   AST 16 01/04/2020   ALT 19 01/04/2020   PROT 7.2 01/04/2020   ALBUMIN 4.4 01/04/2020   CALCIUM 9.2 01/04/2020   Lab  Results  Component Value Date   CHOL 161 01/04/2020   Lab Results  Component Value Date   HDL 46 01/04/2020   Lab Results  Component Value Date   LDLCALC 92 01/04/2020   Lab Results  Component Value Date   TRIG 130 01/04/2020   Lab Results  Component Value Date   CHOLHDL 3.5 01/04/2020   Lab Results  Component Value Date   HGBA1C 5.6 01/04/2020      Assessment & Plan:   Problem List Items Addressed This Visit   None Visit Diagnoses     Rash and nonspecific skin eruption    -  Primary   Relevant Medications   triamcinolone (KENALOG) 0.1 %       Meds ordered this encounter  Medications   triamcinolone (KENALOG) 0.1 %    Sig: Apply 1 application topically 2 (two) times daily.    Dispense:  30 g    Refill:  0    Order Specific Question:   Supervising Provider    Answer:   Neva Seat, JEFFREY R [[id number]]   predniSONE (DELTASONE) 10 MG tablet    Sig: Take 3 tablets (30 mg total) by mouth daily with breakfast for 3 days, THEN 2 tablets (20 mg total) daily with breakfast for 3 days, THEN 1 tablet (10 mg total) daily with breakfast for 3 days.    Dispense:  18 tablet    Refill:  0    Order Specific Question:   Supervising Provider    Answer:   Neva Seat, JEFFREY R [[id number]]    Follow-up: No follow-ups on file.   PLAN Med reaction vs eczema Prednisone taper and topical kenalog Return if worsening or failing ot improve Can consider derm referral. Patient encouraged to call clinic with any questions, comments, or concerns.  Janeece Agee, NP

## 2021-11-08 ENCOUNTER — Other Ambulatory Visit: Payer: Self-pay

## 2021-11-08 ENCOUNTER — Encounter: Payer: Self-pay | Admitting: Nurse Practitioner

## 2021-11-08 ENCOUNTER — Ambulatory Visit: Payer: Self-pay | Admitting: Nurse Practitioner

## 2021-11-08 VITALS — BP 112/76 | HR 78 | Temp 96.0°F | Resp 16 | Ht 60.0 in | Wt 150.0 lb

## 2021-11-08 DIAGNOSIS — Z7185 Encounter for immunization safety counseling: Secondary | ICD-10-CM

## 2021-11-08 DIAGNOSIS — Z789 Other specified health status: Secondary | ICD-10-CM

## 2021-11-08 DIAGNOSIS — Z889 Allergy status to unspecified drugs, medicaments and biological substances status: Secondary | ICD-10-CM

## 2021-11-08 NOTE — Progress Notes (Signed)
New Patient Office Visit  Subjective:  Patient ID: Grace Lawrence, female    DOB: Aug 06, 1966  Age: 56 y.o. MRN: [id number]  CC: Wellness check and needed immunizations.    HPI Grace Lawrence presents for wellness check and needs immunizations. Pt. States she does not currently have a PCP.  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 [employer] by 11/15/2021.  Otherwise, she has no concerns during this visit.  Denies fevers, chills, chest pain, wheezing, or SOB.    Past Medical History:  Diagnosis Date   Allergy    Anemia    Heart murmur    IBS (irritable bowel syndrome)     Past Surgical History:  Procedure Laterality Date   CESAREAN SECTION      Family History  Problem Relation Age of Onset   Diabetes Mother    Cancer Father        Kidney   Cancer Sister        ? type    Social History   Socioeconomic History   Marital status: Single    Spouse name: Not on file   Number of children: 3   Years of education: Not on file   Highest education level: Not on file  Occupational History   Not on file  Tobacco Use   Smoking status: Never   Smokeless tobacco: Never  Vaping Use   Vaping Use: Never used  Substance and Sexual Activity   Alcohol use: Never   Drug use: No   Sexual activity: Yes    Comment: 1st intercourse 56 yo-More than 5 partners  Other Topics Concern   Not on file  Social History Narrative   Lives with partner and his two sons, and one sons wife.    Lives with 2 of her kids.    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    Social Determinants of Health   Financial Resource Strain: Not on file  Food Insecurity: Not on file  Transportation Needs: Not on file  Physical Activity: Not on file  Stress: Not on file  Social Connections: Not on file  Intimate Partner Violence: Not on file    ROS Review of Systems See HPI  Objective:   Today's Vitals: BP 112/76 (BP  Location: Right Arm, Patient Position: Sitting, Cuff Size: Normal)    Pulse 78    Temp (!) 96 F (35.6 C) (Tympanic)    Resp 16    Ht 5' (1.524 m)    Wt 150 lb (68 kg)    LMP 08/01/2014    SpO2 97%    BMI 29.29 kg/m   Physical Exam  Assessment & Plan:   Problem List Items Addressed This Visit   None Visit Diagnoses     Participant in health and wellness plan    -  Primary  Commended on healthy lifestyle practices. Encouraged to continue regimen. Mammogram deferred to later date. Employer to schedule mobile mammogram to be onsite in upcoming months this year.  Colonoscopy deferred for later date.  RTC for fasting lipid panel, CBC w/ diff, TSH, Hep C.     Immunization counseling      Will order MMR, Varicella, and TDap.  Reviewed Shingrix vaccine at this visit. Deferred for a future visit.  Will call pt for an appointment when vaccines are available in office.    H/O multiple allergies      Controlled.  Continue medication regimen.    RTC when immunizations are in office and for fasting blood draws, or sooner if needed.   Outpatient Encounter Medications as of 11/08/2021  Medication Sig   cetirizine (ZYRTEC) 10 MG tablet Chew 10 mg by mouth daily.   diphenhydrAMINE (BENADRYL) 25 MG tablet Take 25 mg by mouth every 6 (six) hours as needed.   fexofenadine-pseudoephedrine (ALLEGRA-D 24) 180-240 MG 24 hr tablet Take 1 tablet by mouth daily.   Pseudoephedrine HCl (SUDAFED 24 HOUR PO) Take by mouth.   [DISCONTINUED] triamcinolone (KENALOG) 0.1 % Apply 1 application topically 2 (two) times daily. (Patient not taking: Reported on 11/08/2021)   No facility-administered encounter medications on file as of 11/08/2021.    Follow-up: No follow-ups on file.   Drucilla Chalet, NP

## 2021-11-09 ENCOUNTER — Telehealth: Payer: Self-pay | Admitting: Nurse Practitioner

## 2021-11-09 NOTE — Telephone Encounter (Signed)
Called patient at 0900 to update on immunization order for clinic. No answer. Message left to make an appointment with the clinic on 1/7 Tuesday morning to receive immunizations and have lab draws done for annual wellness exam.   Terie Purser

## 2021-11-13 ENCOUNTER — Other Ambulatory Visit: Payer: Self-pay

## 2021-11-13 ENCOUNTER — Ambulatory Visit: Payer: Self-pay | Admitting: Nurse Practitioner

## 2021-11-13 ENCOUNTER — Encounter: Payer: Self-pay | Admitting: Nurse Practitioner

## 2021-11-13 DIAGNOSIS — Z23 Encounter for immunization: Secondary | ICD-10-CM

## 2021-11-13 NOTE — Progress Notes (Signed)
Acute Office Visit  Subjective:    Patient ID: Grace Lawrence, female    DOB: 1965-11-21, 56 y.o.   MRN: [id number]  Chief Complaint  Patient presents with   Immunizations    HPI Patient is in today for MMR, Tdap, Varicella vaccines. 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  Denies fevers, chills, or malaise.  No other concerns at this time.   Past Medical History:  Diagnosis Date   Allergy    Anemia    Heart murmur    IBS (irritable bowel syndrome)     Past Surgical History:  Procedure Laterality Date   CESAREAN SECTION      Family History  Problem Relation Age of Onset   Diabetes Mother    Cancer Father        Kidney   Cancer Sister        ? type    Social History   Socioeconomic History   Marital status: Single    Spouse name: Not on file   Number of children: 3   Years of education: Not on file   Highest education level: Not on file  Occupational History   Not on file  Tobacco Use   Smoking status: Never   Smokeless tobacco: Never  Vaping Use   Vaping Use: Never used  Substance and Sexual Activity   Alcohol use: Never   Drug use: No   Sexual activity: Yes    Comment: 1st intercourse 56 yo-More than 5 partners  Other Topics Concern   Not on file  Social History Narrative   Lives with partner and his two sons, and one sons wife.    Lives with 2 of her kids.    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    Social Determinants of Health   Financial Resource Strain: Not on file  Food Insecurity: Not on file  Transportation Needs: Not on file  Physical Activity: Not on file  Stress: Not on file  Social Connections: Not on file  Intimate Partner Violence: Not on file    Outpatient Medications Prior to Visit  Medication Sig Dispense Refill   cetirizine (ZYRTEC) 10 MG tablet Chew 10 mg by mouth daily.     diphenhydrAMINE (BENADRYL) 25 MG tablet Take 25 mg by mouth every 6 (six) hours as needed.      fexofenadine-pseudoephedrine (ALLEGRA-D 24) 180-240 MG 24 hr tablet Take 1 tablet by mouth daily.     Pseudoephedrine HCl (SUDAFED 24 HOUR PO) Take by mouth.     No facility-administered medications prior to visit.    Allergies  Allergen Reactions   Aspergillus Allergy Skin Test    Codeine Itching   Erythromycin Nausea And Vomiting and Swelling   Sulfa Antibiotics Other (See Comments)    child    Review of Systems See HPI.     Objective:    Physical Exam   Health Maintenance Due  Topic Date Due   HIV Screening  Never done   Zoster Vaccines- Shingrix (1 of 2) Never done   PAP SMEAR-Modifier  11/26/2013   INFLUENZA VACCINE  Never done    There are no preventive care reminders to display for this patient.     Assessment & Plan:   Problem List Items Addressed This Visit   None Visit Diagnoses     Immunization due    -  Primary   Relevant Orders   MMR vaccine subcutaneous (Completed)   Varicella vaccine subcutaneous (Completed)  Tdap vaccine greater than or equal to 7yo IM (Completed)     Discussed MMR, Varicella, and Tdap vaccines with patient. Given opportunity to ask all questions. All questions addressed.  Subcutaneous Varicella and MMR vaccines administered in Left posterior tricep aspect of arm separately. Stayed 30 minutes after administration for observation after administration. No reactions noted.   IM Tdap vaccine administered in Right deltoid. Stayed 30 minutes for observation after administration. No reactions noted.  Handouts given for all three vaccines obtained from [facility] website. Education given on possible side effects and reactions. RTC or urgent care if possible reactions occur. Educated to call 911 for severe reactions and red flag signs.  RTC for 2nd dose of MMR and Varicella after 28 days or get testing for immunization as recommended by employer/CDC.    No orders of the defined types were placed in this encounter.    Drucilla Chalet, NP

## 2021-11-13 NOTE — Patient Instructions (Signed)
RTC for fasting lab draw.

## 2021-12-11 ENCOUNTER — Ambulatory Visit: Payer: Commercial Managed Care - PPO | Admitting: Nurse Practitioner

## 2021-12-25 ENCOUNTER — Ambulatory Visit: Payer: Self-pay | Admitting: Nurse Practitioner

## 2021-12-25 ENCOUNTER — Other Ambulatory Visit: Payer: Self-pay

## 2021-12-25 DIAGNOSIS — Z23 Encounter for immunization: Secondary | ICD-10-CM

## 2021-12-25 DIAGNOSIS — R0981 Nasal congestion: Secondary | ICD-10-CM

## 2021-12-25 DIAGNOSIS — H9201 Otalgia, right ear: Secondary | ICD-10-CM

## 2021-12-25 NOTE — Progress Notes (Signed)
? ?Acute Office Visit ? ?Subjective:  ? ? Patient ID: Grace Lawrence, female    DOB: 25-Jun-1966, 56 y.o.   MRN: [id number] ? ?No chief complaint on file. ? ? ?HPI ?Patient is in today for 2nd dose of Varicella vaccine. 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 She was going to get her 2nd dose of MMR today as well, but received military records of her complete MMR series from high school.  ?She is having some sinus congestion and a right ear ache the past few days. Clear nasal drainage and + for postnasal drip. States she does have moderate to severe seasonal allergies. She has been taking her cetirizine and allegra, but not daily. Plans to take them daily from today forward.  ?Denies fevers, chills, or malaise.Denies reaction to first dose of vaccine given on 11/13/2021. ? ?Evagelia asked about the Hep B vaccine series as well. Due to the nature of her clinicals and future job, she wants to see if she can start and complete the Hep B series. She is unsure if she has completed this series in the past. She is going to email me a copy of her previous vaccines.  ? ?No other concerns at this time.  ? ? ? ?Past Medical History:  ?Diagnosis Date  ? Allergy   ? Anemia   ? Heart murmur   ? IBS (irritable bowel syndrome)   ? ? ?Past Surgical History:  ?Procedure Laterality Date  ? CESAREAN SECTION    ? ? ?Family History  ?Problem Relation Age of Onset  ? Diabetes Mother   ? Cancer Father   ?     Kidney  ? Cancer Sister   ?     ? type  ? ? ?Social History  ? ?Socioeconomic History  ? Marital status: Single  ?  Spouse name: Not on file  ? Number of children: 3  ? Years of education: Not on file  ? Highest education level: Not on file  ?Occupational History  ? Not on file  ?Tobacco Use  ? Smoking status: Never  ? Smokeless tobacco: Never  ?Vaping Use  ? Vaping Use: Never used  ?Substance and Sexual Activity  ? Alcohol use: Never  ? Drug use: No  ? Sexual activity: Yes  ?  Comment: 1st intercourse  56 yo-More than 5 partners  ?Other Topics Concern  ? Not on file  ?Social History Narrative  ? Lives with partner and his two sons, and one sons wife.   ? Lives with 2 of her kids.   ? 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   ? ?Social Determinants of Health  ? ?Financial Resource Strain: Not on file  ?Food Insecurity: Not on file  ?Transportation Needs: Not on file  ?Physical Activity: Not on file  ?Stress: Not on file  ?Social Connections: Not on file  ?Intimate Partner Violence: Not on file  ? ? ?Outpatient Medications Prior to Visit  ?Medication Sig Dispense Refill  ? cetirizine (ZYRTEC) 10 MG tablet Chew 10 mg by mouth daily.    ? diphenhydrAMINE (BENADRYL) 25 MG tablet Take 25 mg by mouth every 6 (six) hours as needed.    ? fexofenadine-pseudoephedrine (ALLEGRA-D 24) 180-240 MG 24 hr tablet Take 1 tablet by mouth daily.    ? Pseudoephedrine HCl (SUDAFED 24 HOUR PO) Take by mouth.    ? ?No facility-administered medications prior to visit.  ? ? ?Allergies  ?Allergen Reactions  ? Aspergillus Allergy Skin  Test   ? Codeine Itching  ? Erythromycin Nausea And Vomiting and Swelling  ? Sulfa Antibiotics Other (See Comments)  ?  child  ? ? ?Review of Systems ?See HPI.  ?   ?Objective:  ?  ?Physical Exam ?Constitutional:   ?   Appearance: Normal appearance. She is not ill-appearing.  ?HENT:  ?   Head: Normocephalic and atraumatic.  ?   Right Ear: Ear canal and external ear normal. A middle ear effusion is present.  ?   Left Ear: Ear canal and external ear normal. A middle ear effusion is present.  ?   Nose: Congestion present.  ?   Mouth/Throat:  ?   Mouth: Mucous membranes are moist.  ?   Pharynx: Oropharynx is clear.  ?Eyes:  ?   Conjunctiva/sclera: Conjunctivae normal.  ?Neurological:  ?   Mental Status: She is alert.  ? ? ? ?Health Maintenance Due  ?Topic Date Due  ? Hepatitis C Screening  Never done  ? PAP SMEAR-Modifier  11/26/2013  ? ? ?There are no preventive care reminders to display for this patient. ? ?    ?Assessment & Plan:  ? ?Problem List Items Addressed This Visit   ?None ?Visit Diagnoses   ? ? Immunization due    -  Primary  ? Relevant Orders  ? Varicella vaccine subcutaneous (Completed) ? ?Discussed MMR and Varicella vaccines with patient. Given opportunity to ask all questions. All questions addressed.  ?Subcutaneous Varicella vaccine administered in Left posterior tricep aspect of arm separately. Stayed 30 minutes after administration for observation after administration. No immediate reactions noted.  ?She is going to email me a copy of her vaccine records for the MMR for confirmation. If MMR series complete, will not give her second dose. It will be up to her future employer if she needs titers drawn for immunity.  ?Handouts given for all three vaccines obtained from [facility] website. Education given on possible side effects and reactions. RTC or urgent care if possible reactions occur. Educated to call 911 for severe reactions and red flag signs.  ?Discussed the Hep B series and the benefits for starting and completing the immunization series due to her future job and high risk for BBP exposure. She will email me her past immunization records to assess the need for Hep B vaccine series. Will draw Hep B titers to establish immunity status and go from there. Pt. Understands and is agreeable to plan. ?  ? Complaint of nasal congestion      ? Right ear pain     ? ?Ongoing. Symptoms seem to be of environmental allergy etiology vs. Bacterial/viral.  ?Advised to take her OTC oral antihistamines daily for maximum efficacy, in conjunction with nasal saline or netty pot, per patient preference, and OTC nasal corticosteroid daily in each nare.  ?RTC if symptoms worsen, or as needed.   ? ? ? ?No orders of the defined types were placed in this encounter. ? ? ? ?Drucilla Chalet, NP ? ?

## 2022-06-04 ENCOUNTER — Ambulatory Visit: Payer: Commercial Managed Care - PPO | Admitting: Nurse Practitioner

## 2022-06-04 DIAGNOSIS — L2389 Allergic contact dermatitis due to other agents: Secondary | ICD-10-CM

## 2022-06-04 MED ORDER — PREDNISONE 20 MG PO TABS: 20.0000 mg | ORAL_TABLET | Freq: Every day | ORAL | 0 refills | Status: AC

## 2022-06-04 MED ORDER — HYDROXYZINE PAMOATE 25 MG PO CAPS: 25.0000 mg | ORAL_CAPSULE | Freq: Three times a day (TID) | ORAL | 0 refills | Status: DC | PRN

## 2022-06-04 NOTE — Progress Notes (Signed)
  Acute Office Visit  Subjective:     Patient ID: Grace Lawrence, female    DOB: 1965/10/20, 56 y.o.   MRN: [id number]  Chief Complaint  Patient presents with   Rash   Patient presents today for c/o of rash on left arm.  Rash appeared 7-10 days ago. 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 States she is allergic to several things including grass and mildew. She believes she may have been exposed to aspergillus maybe in water or gloves while at work which has caused recent rash left arm and wrist area.  Reports that since developing rash, area has not gotten worse but has failed to improve despite supportive measures at home.    She takes daily Allegra and Zyrtec as well as Bendaryl for itching. Also keeps area clean and dry and has applied vaseline to site.  Previously has followed allergist for this but she states she cannot afford insurance at this time.   Review of Systems  Skin:  Positive for itching and rash.       Objective:    LMP 08/01/2014    Physical Exam Constitutional:      General: She is not in acute distress. Pulmonary:     Effort: Pulmonary effort is normal.  Musculoskeletal:        General: Normal range of motion.  Skin:    Findings: Rash present. Rash is vesicular.          Comments: Area is slight swollen and erythematous. 3-4 vesicles present, also noted to have area of papular rash. No drainage noted to site.   Neurological:     General: No focal deficit present.     Mental Status: She is alert.     No results found for any visits on 06/04/22.      Assessment & Plan:   Problem List Items Addressed This Visit   None Visit Diagnoses     Allergic contact dermatitis due to other agents    -  Primary   Relevant Medications   predniSONE (DELTASONE) 20 MG tablet   hydrOXYzine (VISTARIL) 25 MG capsule     Discussed avoiding concurrent use of benadryl and hydroxyzine. Return if symptoms  fail to improve.   Meds ordered this encounter  Medications   predniSONE (DELTASONE) 20 MG tablet    Sig: Take 1 tablet (20 mg total) by mouth daily with breakfast for 5 days.    Dispense:  5 tablet    Refill:  0   hydrOXYzine (VISTARIL) 25 MG capsule    Sig: Take 1 capsule (25 mg total) by mouth every 8 (eight) hours as needed for itching.    Dispense:  30 capsule    Refill:  0    Return if symptoms worsen or fail to improve.  Gloris Ham, NP

## 2023-01-14 ENCOUNTER — Ambulatory Visit: Payer: Commercial Managed Care - PPO | Admitting: Nurse Practitioner

## 2023-01-14 ENCOUNTER — Encounter: Payer: Self-pay | Admitting: Nurse Practitioner

## 2023-01-14 VITALS — BP 118/80 | HR 77 | Temp 98.1°F

## 2023-01-14 DIAGNOSIS — M25561 Pain in right knee: Secondary | ICD-10-CM

## 2023-01-14 MED ORDER — NAPROXEN 500 MG PO TABS
500.0000 mg | ORAL_TABLET | Freq: Two times a day (BID) | ORAL | 0 refills | Status: DC
Start: 2023-01-14 — End: 2024-04-27

## 2023-01-14 NOTE — Progress Notes (Signed)
Acute Office Visit  Subjective:     Patient ID: Grace Lawrence, female    DOB: [birth year], 57 y.o.   MRN: [id number]  Chief Complaint  Patient presents with   Knee Pain   Patient presents today for right knee injury that occurred 4 days ago.  States she was outside when her dog ran into  her right knee/leg. She felt immediate pain and fell down. Reports only injury was to her right leg.  Since injury she has noted swelling and pain with walking.   Denies weakness or numbness in right foot. Denies pain at rest Rate pain 5 out 10 when walking/bearing weight.  Denies muscle cramps. Since injury she has implemented RICE and is taking otc ibuprofen as needed. She has not taken any medication today. Reports pain and swelling has steadily improved since Saturday.   Review of Systems  Constitutional:  Negative for chills and fever.  Musculoskeletal:  Positive for joint pain (knee pain).        Objective:    BP 118/80   Pulse 77   Temp 98.1 F (36.7 C)   LMP 08/01/2014   SpO2 97%    Physical Exam Constitutional:      General: She is not in acute distress. Pulmonary:     Effort: Pulmonary effort is normal.  Musculoskeletal:     Right knee: Swelling present. No deformity, erythema or crepitus. Decreased range of motion. Tenderness present.     Right lower leg: Swelling present. No tenderness.       Legs:     Comments: Area of swelling. Skin is intact, no ecchymosis or erythema noted.   Neurological:     General: No focal deficit present.     Mental Status: She is alert and oriented to person, place, and time.     Motor: No weakness.     No results found for any visits on 01/14/23.      Assessment & Plan:   Problem List Items Addressed This Visit   None Visit Diagnoses     Acute pain of right knee    -  Primary   Relevant Medications   naproxen (NAPROSYN) 500 MG tablet     Continue RICE, scheduled naproxen BID x 1 week. Discussing taking medication with  meals. Weight bearing as tolerated. Recommended follow-up with ortho urgent if symptoms fail to improve or worsen.   Meds ordered this encounter  Medications   naproxen (NAPROSYN) 500 MG tablet    Sig: Take 1 tablet (500 mg total) by mouth 2 (two) times daily with a meal for 7 days.    Dispense:  14 tablet    Refill:  0    Order Specific Question:   Supervising Provider    Answer:   Erasmo Downer [[id number]]    As needed.  Gloris Ham, NP

## 2023-04-03 ENCOUNTER — Other Ambulatory Visit: Payer: Self-pay | Admitting: Nurse Practitioner

## 2023-04-03 DIAGNOSIS — Z1231 Encounter for screening mammogram for malignant neoplasm of breast: Secondary | ICD-10-CM

## 2023-04-03 NOTE — Progress Notes (Signed)
Patient is interested in obtaining screening mammogram via mobile mammogram bus at [facility]. No concerns at this time. Last mammogram several years ago. Order placed. 

## 2023-04-09 ENCOUNTER — Ambulatory Visit
Admission: RE | Admit: 2023-04-09 | Discharge: 2023-04-09 | Disposition: A | Discharge status: Home / Self Care | Payer: Self-pay | Source: Ambulatory Visit | Attending: Obstetrics and Gynecology | Admitting: Obstetrics and Gynecology

## 2023-04-09 DIAGNOSIS — Z1231 Encounter for screening mammogram for malignant neoplasm of breast: Secondary | ICD-10-CM

## 2024-04-27 ENCOUNTER — Encounter: Payer: Self-pay | Admitting: Nurse Practitioner

## 2024-04-27 ENCOUNTER — Ambulatory Visit: Payer: Self-pay | Admitting: Nurse Practitioner

## 2024-04-27 VITALS — BP 118/82 | HR 76 | Temp 97.5°F

## 2024-04-27 DIAGNOSIS — J3089 Other allergic rhinitis: Secondary | ICD-10-CM

## 2024-04-27 DIAGNOSIS — H6591 Unspecified nonsuppurative otitis media, right ear: Secondary | ICD-10-CM

## 2024-04-27 NOTE — Progress Notes (Signed)
 Acute Office Visit  Subjective:     Patient ID: Grace Lawrence, female    DOB: Sep 12, 1966, 58 y.o.   MRN: [id number]  Chief Complaint  Patient presents with   ear and jaw pain    HPI Patient is in today for pain in right ear and right jaw Reports she was exposed to aspergillus 2 weeks ago after eating pancakes that contained vegetable oil (which contains aspergillus). She woke up the next day with a swollen throat, cheeks, gums etc.treated this allergic reaction with benadryl and ibuprofen for pain. She does not have an epi-pen.  Was instructed to take allegra and zyrtec daily by her allergist but admits she hasn't taken this in a week and half.  Reports fluid in ears, congestion in sinuses.  Also some pain in her right cheek. Has been taking sudafed for congestion. Denies generalized rash or itching.   Review of Systems  Constitutional:  Positive for chills. Negative for fever and malaise/fatigue.  HENT:  Positive for congestion, ear pain and sore throat. Negative for ear discharge, hearing loss, sinus pain and tinnitus.   Respiratory:  Negative for cough, shortness of breath and wheezing.   Cardiovascular:  Negative for chest pain.  Gastrointestinal: Negative.   Skin:  Negative for itching and rash.        Objective:    BP 118/82 (BP Location: Left Arm, Patient Position: Sitting, Cuff Size: Normal)   Pulse 76   Temp (!) 97.5 F (36.4 C) (Temporal)   LMP 08/01/2014   SpO2 98%    Physical Exam Constitutional:      General: She is not in acute distress. HENT:     Head: Normocephalic.     Jaw: No tenderness or swelling.     Right Ear: Ear canal and external ear normal. No tenderness. A middle ear effusion is present. There is no impacted cerumen. Tympanic membrane is not injected or erythematous.     Left Ear: Ear canal and external ear normal.  No middle ear effusion. There is no impacted cerumen.     Nose:     Right Sinus: No maxillary sinus tenderness or frontal  sinus tenderness.     Left Sinus: No maxillary sinus tenderness or frontal sinus tenderness.  Cardiovascular:     Rate and Rhythm: Normal rate and regular rhythm.     Heart sounds: Normal heart sounds.  Pulmonary:     Effort: Pulmonary effort is normal. No respiratory distress.     Breath sounds: Normal breath sounds.  Musculoskeletal:        General: Normal range of motion.     Right lower leg: No edema.     Left lower leg: No edema.  Skin:    General: Skin is warm.     Findings: No rash.  Neurological:     General: No focal deficit present.     Mental Status: She is alert and oriented to person, place, and time.     No results found for any visits on 04/27/24.      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 No signs of infection. Recommended restarting daily antihistamines and reserve benadryl for only when truly needed. Also recommended starting flonase  daily BID. Can consider switching over to mucinex for congestion. If symptoms fail to improve with conservative treatment, can consider short course of steroids. Also recommended having epi-pen but patient declined prescription at this time. States she will be more careful in avoiding allergen. Discussed benefit of epi-pen as life saving treatment but patient declined at this time.      No orders of the defined types were placed in this encounter.   As needed.  Aritza Brunet Flores Zoii Florer, NP

## 2024-06-10 ENCOUNTER — Ambulatory Visit: Payer: Self-pay | Admitting: Nurse Practitioner
